# Patient Record
Sex: Female | Born: 1975 | Race: White | Marital: Married | Smoking: Never smoker
Health system: Southern US, Community
[De-identification: ages and names within clinical notes are randomized; demographics above are authoritative.]

## PROBLEM LIST (undated history)

## (undated) DIAGNOSIS — M797 Fibromyalgia: Secondary | ICD-10-CM

## (undated) DIAGNOSIS — F32A Depression, unspecified: Secondary | ICD-10-CM

## (undated) DIAGNOSIS — D649 Anemia, unspecified: Secondary | ICD-10-CM

## (undated) DIAGNOSIS — IMO0002 Reserved for concepts with insufficient information to code with codable children: Secondary | ICD-10-CM

## (undated) DIAGNOSIS — F419 Anxiety disorder, unspecified: Secondary | ICD-10-CM

## (undated) DIAGNOSIS — G43909 Migraine, unspecified, not intractable, without status migrainosus: Secondary | ICD-10-CM

## (undated) DIAGNOSIS — N83209 Unspecified ovarian cyst, unspecified side: Secondary | ICD-10-CM

## (undated) DIAGNOSIS — I959 Hypotension, unspecified: Secondary | ICD-10-CM

## (undated) DIAGNOSIS — D869 Sarcoidosis, unspecified: Secondary | ICD-10-CM

## (undated) DIAGNOSIS — F329 Major depressive disorder, single episode, unspecified: Secondary | ICD-10-CM

## (undated) HISTORY — DX: Anemia, unspecified: D64.9

## (undated) HISTORY — PX: HAND TENDON SURGERY: SHX663

## (undated) HISTORY — DX: Major depressive disorder, single episode, unspecified: F32.9

## (undated) HISTORY — DX: Sarcoidosis, unspecified: D86.9

## (undated) HISTORY — DX: Fibromyalgia: M79.7

## (undated) HISTORY — DX: Hypotension, unspecified: I95.9

## (undated) HISTORY — DX: Reserved for concepts with insufficient information to code with codable children: IMO0002

## (undated) HISTORY — DX: Anxiety disorder, unspecified: F41.9

## (undated) HISTORY — DX: Depression, unspecified: F32.A

## (undated) HISTORY — DX: Unspecified ovarian cyst, unspecified side: N83.209

## (undated) HISTORY — PX: TUBAL LIGATION: SHX77

## (undated) HISTORY — DX: Migraine, unspecified, not intractable, without status migrainosus: G43.909

---

## 1997-02-13 DIAGNOSIS — IMO0002 Reserved for concepts with insufficient information to code with codable children: Secondary | ICD-10-CM

## 1997-02-13 HISTORY — DX: Reserved for concepts with insufficient information to code with codable children: IMO0002

## 2004-02-14 HISTORY — PX: OTHER SURGICAL HISTORY: SHX169

## 2008-02-14 DIAGNOSIS — D869 Sarcoidosis, unspecified: Secondary | ICD-10-CM

## 2008-02-14 HISTORY — DX: Sarcoidosis, unspecified: D86.9

## 2011-02-14 DIAGNOSIS — M797 Fibromyalgia: Secondary | ICD-10-CM

## 2011-02-14 HISTORY — DX: Fibromyalgia: M79.7

## 2011-11-21 ENCOUNTER — Other Ambulatory Visit: Payer: Self-pay | Admitting: *Deleted

## 2011-11-21 ENCOUNTER — Other Ambulatory Visit: Payer: Self-pay

## 2011-11-21 DIAGNOSIS — M5412 Radiculopathy, cervical region: Secondary | ICD-10-CM

## 2011-11-25 ENCOUNTER — Other Ambulatory Visit: Payer: Self-pay

## 2011-11-28 ENCOUNTER — Ambulatory Visit
Admission: RE | Admit: 2011-11-28 | Discharge: 2011-11-28 | Disposition: A | Discharge status: Home / Self Care | Payer: Self-pay | Source: Ambulatory Visit | Attending: *Deleted | Admitting: *Deleted

## 2011-11-28 DIAGNOSIS — M5412 Radiculopathy, cervical region: Secondary | ICD-10-CM

## 2012-10-30 ENCOUNTER — Ambulatory Visit (HOSPITAL_COMMUNITY)
Admission: RE | Admit: 2012-10-30 | Discharge: 2012-10-30 | Disposition: A | Discharge status: Home / Self Care | Payer: BC Managed Care – PPO | Source: Ambulatory Visit | Attending: Physician Assistant | Admitting: Physician Assistant

## 2012-10-30 ENCOUNTER — Ambulatory Visit (INDEPENDENT_AMBULATORY_CARE_PROVIDER_SITE_OTHER): Payer: BC Managed Care – PPO | Admitting: Family Medicine

## 2012-10-30 VITALS — BP 106/70 | HR 72 | Temp 98.5°F | Resp 18 | Ht 68.5 in | Wt 151.2 lb

## 2012-10-30 DIAGNOSIS — N83209 Unspecified ovarian cyst, unspecified side: Secondary | ICD-10-CM | POA: Insufficient documentation

## 2012-10-30 DIAGNOSIS — R102 Pelvic and perineal pain: Secondary | ICD-10-CM

## 2012-10-30 DIAGNOSIS — N949 Unspecified condition associated with female genital organs and menstrual cycle: Secondary | ICD-10-CM

## 2012-10-30 DIAGNOSIS — D869 Sarcoidosis, unspecified: Secondary | ICD-10-CM | POA: Insufficient documentation

## 2012-10-30 DIAGNOSIS — M549 Dorsalgia, unspecified: Secondary | ICD-10-CM

## 2012-10-30 DIAGNOSIS — M545 Low back pain, unspecified: Secondary | ICD-10-CM | POA: Insufficient documentation

## 2012-10-30 DIAGNOSIS — M797 Fibromyalgia: Secondary | ICD-10-CM | POA: Insufficient documentation

## 2012-10-30 DIAGNOSIS — F329 Major depressive disorder, single episode, unspecified: Secondary | ICD-10-CM | POA: Insufficient documentation

## 2012-10-30 LAB — POCT URINALYSIS DIPSTICK
Bilirubin, UA: NEGATIVE
Blood, UA: NEGATIVE
Glucose, UA: NEGATIVE
Nitrite, UA: NEGATIVE
Spec Grav, UA: 1.01

## 2012-10-30 LAB — POCT CBC
HCT, POC: 33.6 % — AB (ref 37.7–47.9)
Lymph, poc: 2.2 (ref 0.6–3.4)
MCH, POC: 26 pg — AB (ref 27–31.2)
MCHC: 31 g/dL — AB (ref 31.8–35.4)
POC Granulocyte: 4.4 (ref 2–6.9)
POC LYMPH PERCENT: 30.3 %L (ref 10–50)
POC MID %: 7.3 %M (ref 0–12)
RDW, POC: 14.9 %

## 2012-10-30 LAB — POCT UA - MICROSCOPIC ONLY
Bacteria, U Microscopic: NEGATIVE
Mucus, UA: NEGATIVE
WBC, Ur, HPF, POC: NEGATIVE

## 2012-10-30 LAB — POCT URINE PREGNANCY: Preg Test, Ur: NEGATIVE

## 2012-10-30 LAB — POCT WET PREP WITH KOH
KOH Prep POC: NEGATIVE
Trichomonas, UA: NEGATIVE

## 2012-10-30 MED ORDER — IOHEXOL 300 MG/ML  SOLN
100.0000 mL | Freq: Once | INTRAMUSCULAR | Status: AC | PRN
  Administered 2012-10-30: 100 mL via INTRAVENOUS

## 2012-10-30 NOTE — Progress Notes (Signed)
Subjective:    Patient ID: Leslie Huber, female    DOB: 05-02-1975, 37 y.o.   MRN: [id number]  HPI This 37 y.o. female presents for evaluation of 3 days of RIGHT sided back pain that wraps around to the RIGHT pelvis.  Expects her menses to begin in about 15 days.  Worse that her typical mittleshmertz pain.  History of frequent UTI.  Has had nocturia for the past several nights, but wasn't surprised since she's increased her fluid volume.  No urgency or frequency.  No hematuria noted.  No vaginal d/c, but a little itchy today.  Some nausea (but 53 year-old child had a GI bug last week), but no fever, chills or vomiting. Has noticed that prolonged sitting causes her RIGHT leg to fall asleep, first noticed yesterday helping her daughter with homework, and then again today completing paperwork in our office.  Acknowledges that she doesn't typically spend much time sitting.  Medications, allergies, past medical history, surgical history, family history, social history and problem list reviewed.   Review of Systems As above. No CP, SOB, HA, dizziness.    Objective:   Physical Exam  Constitutional: She is oriented to person, place, and time. Vital signs are normal. She appears well-developed and well-nourished. No distress.  HENT:  Head: Normocephalic and atraumatic.  Cardiovascular: Normal rate, regular rhythm and normal heart sounds.   Pulmonary/Chest: Effort normal and breath sounds normal.  Abdominal: Soft. Normal appearance and bowel sounds are normal. She exhibits no distension and no mass. There is no hepatosplenomegaly. There is tenderness in the right lower quadrant and suprapubic area. There is no rigidity, no rebound, no guarding, no CVA tenderness, no tenderness at McBurney's point and negative Murphy's sign. No hernia. Hernia confirmed negative in the right inguinal area and confirmed negative in the left inguinal area.  Genitourinary: Vagina normal and uterus normal. Rectal exam shows  tenderness (RLQ). Rectal exam shows no external hemorrhoid, no internal hemorrhoid, no fissure, no mass and anal tone normal. Guaiac negative stool. Pelvic exam was performed with patient supine. No labial fusion. There is no rash, tenderness, lesion or injury on the right labia. There is no rash, tenderness, lesion or injury on the left labia. Cervix exhibits no motion tenderness, no discharge and no friability. Right adnexum displays tenderness. Right adnexum displays no mass and no fullness. Left adnexum displays no mass, no tenderness and no fullness.  Musculoskeletal: Normal range of motion.       Lumbar back: Normal.  Lymphadenopathy:       Right: No inguinal adenopathy present.       Left: No inguinal adenopathy present.  Neurological: She is alert and oriented to person, place, and time.  Skin: Skin is warm and dry. No rash noted. She is not diaphoretic. No pallor.  Psychiatric: She has a normal mood and affect. Her speech is normal and behavior is normal. Judgment normal.     Results for orders placed in visit on 10/30/12  POCT UA - MICROSCOPIC ONLY      Result Value Range   WBC, Ur, HPF, POC neg     RBC, urine, microscopic neg     Bacteria, U Microscopic neg     Mucus, UA neg     Epithelial cells, urine per micros neg     Crystals, Ur, HPF, POC neg     Casts, Ur, LPF, POC neg     Yeast, UA neg    POCT URINALYSIS DIPSTICK  Result Value Range   Color, UA light yellow     Clarity, UA clear     Glucose, UA neg     Bilirubin, UA neg     Ketones, UA neg     Spec Grav, UA 1.010     Blood, UA neg     pH, UA 7.0     Protein, UA neg     Urobilinogen, UA 0.2     Nitrite, UA neg     Leukocytes, UA Negative    POCT CBC      Result Value Range   WBC 7.1  4.6 - 10.2 K/uL   Lymph, poc 2.2  0.6 - 3.4   POC LYMPH PERCENT 30.3  10 - 50 %L   MID (cbc) 0.5  0 - 0.9   POC MID % 7.3  0 - 12 %M   POC Granulocyte 4.4  2 - 6.9   Granulocyte percent 62.4  37 - 80 %G   RBC 4.00 (*)  4.04 - 5.48 M/uL   Hemoglobin 10.4 (*) 12.2 - 16.2 g/dL   HCT, POC 16.1 (*) 09.6 - 47.9 %   MCV 84.1  80 - 97 fL   MCH, POC 26.0 (*) 27 - 31.2 pg   MCHC 31.0 (*) 31.8 - 35.4 g/dL   RDW, POC 04.5     Platelet Count, POC 255  142 - 424 K/uL   MPV 10.2  0 - 99.8 fL  POCT WET PREP WITH KOH      Result Value Range   Trichomonas, UA Negative     Clue Cells Wet Prep HPF POC neg     Epithelial Wet Prep HPF POC 8-12     Yeast Wet Prep HPF POC neg     Bacteria Wet Prep HPF POC small     RBC Wet Prep HPF POC 0-2     WBC Wet Prep HPF POC 5-8     KOH Prep POC Negative    POCT URINE PREGNANCY      Result Value Range   Preg Test, Ur Negative          Assessment & Plan:  Back pain  Pelvic pain in female - Plan: POCT UA - Microscopic Only, POCT urinalysis dipstick, POCT CBC, POCT Wet Prep with KOH, POCT urine pregnancy, CT Abdomen Pelvis W Contrast  Suspect ovarian cyst, but concerned for appendicitis.  CT scan now. May need a pelvic US tomorrow. If CT is negative, would use NSAIDS.  Discussed with Dr. Katrinka Blazing.  Fernande Bras, PA-C Physician Assistant-Certified [facility]

## 2012-10-30 NOTE — Patient Instructions (Addendum)
Proceed to [facility] ([address]). Go to the CT department.

## 2012-10-31 ENCOUNTER — Other Ambulatory Visit: Payer: Self-pay | Admitting: Radiology

## 2012-10-31 DIAGNOSIS — N83209 Unspecified ovarian cyst, unspecified side: Secondary | ICD-10-CM

## 2012-11-07 ENCOUNTER — Ambulatory Visit: Payer: BC Managed Care – PPO

## 2012-11-07 ENCOUNTER — Ambulatory Visit (INDEPENDENT_AMBULATORY_CARE_PROVIDER_SITE_OTHER): Payer: BC Managed Care – PPO | Admitting: Family Medicine

## 2012-11-07 VITALS — BP 120/72 | HR 76 | Temp 98.1°F | Resp 18 | Ht 69.0 in | Wt 149.0 lb

## 2012-11-07 DIAGNOSIS — R1031 Right lower quadrant pain: Secondary | ICD-10-CM

## 2012-11-07 DIAGNOSIS — N83209 Unspecified ovarian cyst, unspecified side: Secondary | ICD-10-CM

## 2012-11-07 DIAGNOSIS — M545 Low back pain: Secondary | ICD-10-CM

## 2012-11-07 DIAGNOSIS — M25569 Pain in unspecified knee: Secondary | ICD-10-CM

## 2012-11-07 DIAGNOSIS — M25561 Pain in right knee: Secondary | ICD-10-CM

## 2012-11-07 DIAGNOSIS — G8929 Other chronic pain: Secondary | ICD-10-CM

## 2012-11-07 LAB — POCT CBC
Lymph, poc: 2.2 (ref 0.6–3.4)
MCH, POC: 26.6 pg — AB (ref 27–31.2)
MCV: 85.4 fL (ref 80–97)
MID (cbc): 0.5 (ref 0–0.9)
POC LYMPH PERCENT: 26.3 %L (ref 10–50)
Platelet Count, POC: 308 10*3/uL (ref 142–424)
RBC: 4.77 M/uL (ref 4.04–5.48)
RDW, POC: 15.6 %
WBC: 8.3 10*3/uL (ref 4.6–10.2)

## 2012-11-07 LAB — POCT URINALYSIS DIPSTICK
Glucose, UA: NEGATIVE
Spec Grav, UA: 1.015
Urobilinogen, UA: 0.2

## 2012-11-07 LAB — POCT UA - MICROSCOPIC ONLY: Crystals, Ur, HPF, POC: NEGATIVE

## 2012-11-07 MED ORDER — TRAMADOL HCL 50 MG PO TABS: 50.0000 mg | ORAL_TABLET | Freq: Four times a day (QID) | ORAL | Status: DC | PRN

## 2012-11-07 MED ORDER — CYCLOBENZAPRINE HCL 5 MG PO TABS: ORAL_TABLET | ORAL | Status: DC

## 2012-11-07 NOTE — Patient Instructions (Signed)
Your blood and urine tests appear ok.  Keep follow up with OBGYN tomorrow. Heat or ice to affected area if improves symptoms. Naprosyn during the day as needed and then flexeril if needed at bedtime for muscle spasm. This can be taken up to every 8 hours, but causes sedation, so should not drive or operate heavy machinery while taking this medicine. Ultram if needed for pain, but be careful combining this with other sedating medicines. Return to the clinic or go to the nearest emergency room if any of your symptoms worsen or new symptoms occur. Back Pain, Adult Low back pain is very common. About 1 in 5 people have back pain.The cause of low back pain is rarely dangerous. The pain often gets better over time.About half of people with a sudden onset of back pain feel better in just 2 weeks. About 8 in 10 people feel better by 6 weeks.  CAUSES Some common causes of back pain include:  Strain of the muscles or ligaments supporting the spine.  Wear and tear (degeneration) of the spinal discs.  Arthritis.  Direct injury to the back. DIAGNOSIS Most of the time, the direct cause of low back pain is not known.However, back pain can be treated effectively even when the exact cause of the pain is unknown.Answering your caregiver's questions about your overall health and symptoms is one of the most accurate ways to make sure the cause of your pain is not dangerous. If your caregiver needs more information, he or she may order lab work or imaging tests (X-rays or MRIs).However, even if imaging tests show changes in your back, this usually does not require surgery. HOME CARE INSTRUCTIONS For many people, back pain returns.Since low back pain is rarely dangerous, it is often a condition that people can learn to [facility] their own.   Remain active. It is stressful on the back to sit or stand in one place. Do not sit, drive, or stand in one place for more than 30 minutes at a time. Take short walks on level  surfaces as soon as pain allows.Try to increase the length of time you walk each day.  Do not stay in bed.Resting more than 1 or 2 days can delay your recovery.  Do not avoid exercise or work.Your body is made to move.It is not dangerous to be active, even though your back may hurt.Your back will likely heal faster if you return to being active before your pain is gone.  Pay attention to your body when you bend and lift. Many people have less discomfortwhen lifting if they bend their knees, keep the load close to their bodies,and avoid twisting. Often, the most comfortable positions are those that put less stress on your recovering back.  Find a comfortable position to sleep. Use a firm mattress and lie on your side with your knees slightly bent. If you lie on your back, put a pillow under your knees.  Only take over-the-counter or prescription medicines as directed by your caregiver. Over-the-counter medicines to reduce pain and inflammation are often the most helpful.Your caregiver may prescribe muscle relaxant drugs.These medicines help dull your pain so you can more quickly return to your normal activities and healthy exercise.  Put ice on the injured area.  Put ice in a plastic bag.  Place a towel between your skin and the bag.  Leave the ice on for 15-20 minutes, 3-4 times a day for the first 2 to 3 days. After that, ice and heat may  be alternated to reduce pain and spasms.  Ask your caregiver about trying back exercises and gentle massage. This may be of some benefit.  Avoid feeling anxious or stressed.Stress increases muscle tension and can worsen back pain.It is important to recognize when you are anxious or stressed and learn ways to manage it.Exercise is a great option. SEEK MEDICAL CARE IF:  You have pain that is not relieved with rest or medicine.  You have pain that does not improve in 1 week.  You have new symptoms.  You are generally not feeling well. SEEK  IMMEDIATE MEDICAL CARE IF:   You have pain that radiates from your back into your legs.  You develop new bowel or bladder control problems.  You have unusual weakness or numbness in your arms or legs.  You develop nausea or vomiting.  You develop abdominal pain.  You feel faint. Document Released: 01/30/2005 Document Revised: 08/01/2011 Document Reviewed: 06/20/2010 [facility] Patient Information 2014 [place], [place].

## 2012-11-07 NOTE — Progress Notes (Signed)
Subjective:    Patient ID: Leslie Huber, female    DOB: [birth year], 37 y.o.   MRN: [id number]  HPI Leslie Huber is a 37 y.o. female  See ov last week. Has appt with gynecologist tomorrow morning, but same pain in R abdomen worsened with start of period this morning. R lower abdominal pain since last week. No fever. Slight urinary frequency past few days. Hx of recurrent UTI's in past, last one few years ago. No diarrhea, nausea for 2 days, no vomiting. Drinking fluids. Less appetite - seems to make pain worse with food. Occasionally has felt pain into back, or at times into R leg. On and off for past week. 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 Vaginal bleeding this am. No bowel or bladder incontinence, no saddle anesthesia, no lower extremity weakness.    Tx: naprosyn last night. Taking naprosyn BID regularly (for sarcoidosis and fibromyalgia). One klonopin to help sleep last night.    Seen 10/30/12 by Porfirio Oar, PA-C for pelvic pain. Sent for CT abdomen/pelvis 10/30/12: ABDOMEN/PELVIS:  Liver: No focal abnormality.  Biliary: No evidence of biliary obstruction or stone.  Pancreas: Unremarkable.  Spleen: Unremarkable.  Adrenals: Unremarkable.  Kidneys and ureters: No hydronephrosis or stone.  Bladder: Unremarkable.  Bowel: No obstruction. Appendix not clearly identified, but there is  no pericecal inflammatory changes.  Retroperitoneum: No mass or adenopathy.  Peritoneum: No free fluid or gas.  Reproductive: 3.6 cm cyst in the right ovary, without evidence of  enhancing nodule. The surrounding ovarian parenchyma does not appear  edematous, arguing against torsion.  Vascular: No acute abnormality.  OSSEOUS: No acute abnormalities. No suspicious lytic or blastic  lesions.  IMPRESSION:  1. No acute intra-abdominal findings.  2. 3.6 cm right ovarian cyst.  Labs from prior OV:  Results for orders placed in visit on 10/30/12  POCT UA - MICROSCOPIC ONLY       Result Value Range   WBC, Ur, HPF, POC neg     RBC, urine, microscopic neg     Bacteria, U Microscopic neg     Mucus, UA neg     Epithelial cells, urine per micros neg     Crystals, Ur, HPF, POC neg     Casts, Ur, LPF, POC neg     Yeast, UA neg    POCT URINALYSIS DIPSTICK      Result Value Range   Color, UA light yellow     Clarity, UA clear     Glucose, UA neg     Bilirubin, UA neg     Ketones, UA neg     Spec Grav, UA 1.010     Blood, UA neg     pH, UA 7.0     Protein, UA neg     Urobilinogen, UA 0.2     Nitrite, UA neg     Leukocytes, UA Negative    POCT CBC      Result Value Range   WBC 7.1  4.6 - 10.2 K/uL   Lymph, poc 2.2  0.6 - 3.4   POC LYMPH PERCENT 30.3  10 - 50 %L   MID (cbc) 0.5  0 - 0.9   POC MID % 7.3  0 - 12 %M   POC Granulocyte 4.4  2 - 6.9   Granulocyte percent 62.4  37 - 80 %G   RBC 4.00 (*) 4.04 - 5.48 M/uL   Hemoglobin 10.4 (*) 12.2 - 16.2 g/dL  HCT, POC 33.6 (*) 37.7 - 47.9 %   MCV 84.1  80 - 97 fL   MCH, POC 26.0 (*) 27 - 31.2 pg   MCHC 31.0 (*) 31.8 - 35.4 g/dL   RDW, POC 16.1     Platelet Count, POC 255  142 - 424 K/uL   MPV 10.2  0 - 99.8 fL  POCT WET PREP WITH KOH      Result Value Range   Trichomonas, UA Negative     Clue Cells Wet Prep HPF POC neg     Epithelial Wet Prep HPF POC 8-12     Yeast Wet Prep HPF POC neg     Bacteria Wet Prep HPF POC small     RBC Wet Prep HPF POC 0-2     WBC Wet Prep HPF POC 5-8     KOH Prep POC Negative    POCT URINE PREGNANCY      Result Value Range   Preg Test, Ur Negative       Review of Systems  Genitourinary: Positive for frequency and flank pain. Negative for dysuria and hematuria.  Musculoskeletal: Positive for back pain and arthralgias. Negative for gait problem.  Neurological: Negative for weakness and numbness.       Objective:   Physical Exam  Vitals reviewed. Constitutional: She is oriented to person, place, and time. She appears well-developed and well-nourished.  HENT:    Head: Normocephalic and atraumatic.  Pulmonary/Chest: Effort normal.  Abdominal: Soft. Normal appearance. She exhibits no distension. There is tenderness in the right lower quadrant and suprapubic area. There is no rebound, no guarding and no CVA tenderness.    Musculoskeletal:       Lumbar back: She exhibits decreased range of motion (min decr flexion. ) and tenderness (R lower paraspinal. ). She exhibits no bony tenderness, no swelling and no spasm.       Back:  Neurological: She is alert and oriented to person, place, and time. She has normal strength. No sensory deficit.  Reflex Scores:      Patellar reflexes are 2+ on the right side and 2+ on the left side.      Achilles reflexes are 2+ on the right side and 2+ on the left side. Able to heel and toe walk without difficulty.   Skin: Skin is warm. No rash noted.  Psychiatric: She has a normal mood and affect. Her behavior is normal.   [facility] reading (PRIMARY) by  Dr. Neva Seat: LS spine: no acute findings. incedental residual contrast enhancement of descending colon?  Results for orders placed in visit on 11/07/12  POCT CBC      Result Value Range   WBC 8.3  4.6 - 10.2 K/uL   Lymph, poc 2.2  0.6 - 3.4   POC LYMPH PERCENT 26.3  10 - 50 %L   MID (cbc) 0.5  0 - 0.9   POC MID % 6.2  0 - 12 %M   POC Granulocyte 5.6  2 - 6.9   Granulocyte percent 67.5  37 - 80 %G   RBC 4.77  4.04 - 5.48 M/uL   Hemoglobin 12.7  12.2 - 16.2 g/dL   HCT, POC 09.6  04.5 - 47.9 %   MCV 85.4  80 - 97 fL   MCH, POC 26.6 (*) 27 - 31.2 pg   MCHC 31.2 (*) 31.8 - 35.4 g/dL   RDW, POC 40.9     Platelet Count, POC 308  142 - 424  K/uL   MPV 10.2  0 - 99.8 fL  POCT URINALYSIS DIPSTICK      Result Value Range   Color, UA yellow     Clarity, UA clear     Glucose, UA neg     Bilirubin, UA neg     Ketones, UA neg     Spec Grav, UA 1.015     Blood, UA large     pH, UA 7.0     Protein, UA neg     Urobilinogen, UA 0.2     Nitrite, UA neg     Leukocytes, UA  Negative    POCT UA - MICROSCOPIC ONLY      Result Value Range   WBC, Ur, HPF, POC 0-1     RBC, urine, microscopic 3-5     Bacteria, U Microscopic small     Mucus, UA neg     Epithelial cells, urine per micros 0-3     Crystals, Ur, HPF, POC neg     Casts, Ur, LPF, POC neg     Yeast, UA neg         Assessment & Plan:  MAKAILA WINDLE is a 37 y.o. female  Pain, low back , episodic R leg radiation. ? Spasm vs strain vs fibromyalgia flair - sx care, stretches, flexeril if needed, cont naprosyn, ultram only if needed. rtc if not improving.   Other and unspecified ovarian cyst, RLQ abdominal pain - Plan: traMADol (ULTRAM) 50 MG tablet if needed.  Reassuring U/a and CBC.  Has follow up with OBGYN tomorrow. rtc precautions.   Meds ordered this encounter  Medications  . cyclobenzaprine (FLEXERIL) 5 MG tablet    Sig: 1 pill by mouth up to every 8 hours as needed. Start with one pill by mouth each bedtime as needed due to sedation    Dispense:  15 tablet    Refill:  0  . traMADol (ULTRAM) 50 MG tablet    Sig: Take 1 tablet (50 mg total) by mouth every 6 (six) hours as needed for pain.    Dispense:  20 tablet    Refill:  0     Patient Instructions  Your blood and urine tests appear ok.  Keep follow up with OBGYN tomorrow. Heat or ice to affected area if improves symptoms. Naprosyn during the day as needed and then flexeril if needed at bedtime for muscle spasm. This can be taken up to every 8 hours, but causes sedation, so should not drive or operate heavy machinery while taking this medicine. Ultram if needed for pain, but be careful combining this with other sedating medicines. Return to the clinic or go to the nearest emergency room if any of your symptoms worsen or new symptoms occur. Back Pain, Adult Low back pain is very common. About 1 in 5 people have back pain.The cause of low back pain is rarely dangerous. The pain often gets better over time.About half of people with a sudden  onset of back pain feel better in just 2 weeks. About 8 in 10 people feel better by 6 weeks.  CAUSES Some common causes of back pain include:  Strain of the muscles or ligaments supporting the spine.  Wear and tear (degeneration) of the spinal discs.  Arthritis.  Direct injury to the back. DIAGNOSIS Most of the time, the direct cause of low back pain is not known.However, back pain can be treated effectively even when the exact cause of the pain is unknown.Answering  your caregiver's questions about your overall health and symptoms is one of the most accurate ways to make sure the cause of your pain is not dangerous. If your caregiver needs more information, he or she may order lab work or imaging tests (X-rays or MRIs).However, even if imaging tests show changes in your back, this usually does not require surgery. HOME CARE INSTRUCTIONS For many people, back pain returns.Since low back pain is rarely dangerous, it is often a condition that people can learn to [facility] their own.   Remain active. It is stressful on the back to sit or stand in one place. Do not sit, drive, or stand in one place for more than 30 minutes at a time. Take short walks on level surfaces as soon as pain allows.Try to increase the length of time you walk each day.  Do not stay in bed.Resting more than 1 or 2 days can delay your recovery.  Do not avoid exercise or work.Your body is made to move.It is not dangerous to be active, even though your back may hurt.Your back will likely heal faster if you return to being active before your pain is gone.  Pay attention to your body when you bend and lift. Many people have less discomfortwhen lifting if they bend their knees, keep the load close to their bodies,and avoid twisting. Often, the most comfortable positions are those that put less stress on your recovering back.  Find a comfortable position to sleep. Use a firm mattress and lie on your side with your  knees slightly bent. If you lie on your back, put a pillow under your knees.  Only take over-the-counter or prescription medicines as directed by your caregiver. Over-the-counter medicines to reduce pain and inflammation are often the most helpful.Your caregiver may prescribe muscle relaxant drugs.These medicines help dull your pain so you can more quickly return to your normal activities and healthy exercise.  Put ice on the injured area.  Put ice in a plastic bag.  Place a towel between your skin and the bag.  Leave the ice on for 15-20 minutes, 3-4 times a day for the first 2 to 3 days. After that, ice and heat may be alternated to reduce pain and spasms.  Ask your caregiver about trying back exercises and gentle massage. This may be of some benefit.  Avoid feeling anxious or stressed.Stress increases muscle tension and can worsen back pain.It is important to recognize when you are anxious or stressed and learn ways to manage it.Exercise is a great option. SEEK MEDICAL CARE IF:  You have pain that is not relieved with rest or medicine.  You have pain that does not improve in 1 week.  You have new symptoms.  You are generally not feeling well. SEEK IMMEDIATE MEDICAL CARE IF:   You have pain that radiates from your back into your legs.  You develop new bowel or bladder control problems.  You have unusual weakness or numbness in your arms or legs.  You develop nausea or vomiting.  You develop abdominal pain.  You feel faint. Document Released: 01/30/2005 Document Revised: 08/01/2011 Document Reviewed: 06/20/2010 [facility] Patient Information 2014 [place], [place].

## 2012-11-08 ENCOUNTER — Telehealth: Payer: Self-pay | Admitting: *Deleted

## 2012-11-08 ENCOUNTER — Ambulatory Visit (INDEPENDENT_AMBULATORY_CARE_PROVIDER_SITE_OTHER): Payer: BC Managed Care – PPO | Admitting: Gynecology

## 2012-11-08 ENCOUNTER — Ambulatory Visit (INDEPENDENT_AMBULATORY_CARE_PROVIDER_SITE_OTHER): Payer: BC Managed Care – PPO

## 2012-11-08 ENCOUNTER — Encounter: Payer: Self-pay | Admitting: Gynecology

## 2012-11-08 VITALS — BP 120/76 | Ht 66.5 in | Wt 148.0 lb

## 2012-11-08 DIAGNOSIS — N920 Excessive and frequent menstruation with regular cycle: Secondary | ICD-10-CM

## 2012-11-08 DIAGNOSIS — N83201 Unspecified ovarian cyst, right side: Secondary | ICD-10-CM | POA: Insufficient documentation

## 2012-11-08 DIAGNOSIS — R102 Pelvic and perineal pain: Secondary | ICD-10-CM

## 2012-11-08 DIAGNOSIS — N949 Unspecified condition associated with female genital organs and menstrual cycle: Secondary | ICD-10-CM

## 2012-11-08 DIAGNOSIS — D649 Anemia, unspecified: Secondary | ICD-10-CM

## 2012-11-08 DIAGNOSIS — N83209 Unspecified ovarian cyst, unspecified side: Secondary | ICD-10-CM

## 2012-11-08 MED ORDER — KETOROLAC TROMETHAMINE 30 MG/ML IJ SOLN
30.0000 mg | Freq: Once | INTRAMUSCULAR | Status: AC
  Administered 2012-11-08: 30 mg via INTRAMUSCULAR

## 2012-11-08 NOTE — Telephone Encounter (Signed)
Pt was seen today given Toradol 30 IM for pain, pt asked when can she take next dose of oral medication? Time ? Please advise

## 2012-11-08 NOTE — Telephone Encounter (Signed)
6 hours after the injection was given in the office

## 2012-11-08 NOTE — Progress Notes (Signed)
Patient is a 37 year old gravida 4 para 3 Ab1 (3 normal spontaneous vaginal delivery one spontaneous AB) who was referred for practice as a courtesy of [facility] because the patient's right lower quadrant pain and ovarian cyst. The patient stated that she has never had these symptoms before. She states her cycles are regular but very heavy lasting 5-6 days using ultra size pads and has suffered with anemia as a result of this for many years. A CT of the abdomen and pelvis was ordered by the facility to rule out the possibility of appendicitis with the following results reported by radiology.   3.6 cm cyst in the right ovary, without evidence of  enhancing nodule. The surrounding ovarian parenchyma does not appear  edematous, arguing against torsion.  Vascular: No acute abnormality.   IMPRESSION:  1. No acute intra-abdominal findings.  2. 3.6 cm right ovarian cyst.   Labs done September 25: CBC normal, hemoglobin 12.7/  Hematocrit 40.7 white blood count 8.3  Urinalysis negative  Exam: Abdomen soft no rebound or guarding slightly tender right lower quadrant Bartholin urethra Skene gland: Within normal limits Vagina: Menstrual blood present patient states this is her second day of her cycle. Cervix: No lesions or discharge Uterus:tenderness in the cul-de-sac Adnexa: Tenderness and fullness in the right adnexa Rectal exam: Not done  Ultrasound today: Uterus measured 8.6 x 5.6 x 4.7 cm with endometrial stripe is 7.7 mm. No uterine abnormality noted. Left ovary normal. Right ovary complex thick wall vascular cyst measuring 16 x 15 x 15 mm with trace amount of free fluid in the cul-de-sac.  Assessment/plan: Problem #1 physiological cyst that may have ruptured cause patient's symptomatology appears to be regressing in size. The patient in no acute distress today. Pain level 4/10. Patient did receive Toradol 30 mg IM in the office. She will continue using the ultrasound  previously prescribed 50 mg every 6 hours when necessary. Problem #2 patient suffers from menorrhagia and at times has had anemia as a result of this. We discussed different treatment options to include: Mirena IUD, Lysteda,  Her option endometrial ablation or low-dose oral contraceptive pill. Patient is interested in proceeding with a Mirena IUD. She will removal of the transformation provided and returned back to the office to have a Mirena IUD placed or to schedule office endometrial ablation. Patient was reassured. Followup report and this note will be submitted to her primary who referred her to our practice.

## 2012-11-08 NOTE — Telephone Encounter (Signed)
Left the below on pt voicemail. 

## 2012-11-11 ENCOUNTER — Ambulatory Visit: Payer: BC Managed Care – PPO | Admitting: Gynecology

## 2012-11-12 ENCOUNTER — Telehealth: Payer: Self-pay

## 2012-11-12 NOTE — Telephone Encounter (Signed)
I called patient to inform her I checked ins benefits for Mirena IUD and Her Option ablation in the office and both are billable codes with a $25 copymt then ins pays 100%.  No precert required for either.   Patient said she is undecided and will be talking with husband and will get back with [place].

## 2012-12-16 ENCOUNTER — Ambulatory Visit (INDEPENDENT_AMBULATORY_CARE_PROVIDER_SITE_OTHER): Payer: BC Managed Care – PPO | Admitting: Gynecology

## 2012-12-16 ENCOUNTER — Encounter: Payer: Self-pay | Admitting: Gynecology

## 2012-12-16 ENCOUNTER — Other Ambulatory Visit (HOSPITAL_COMMUNITY)
Admission: RE | Admit: 2012-12-16 | Discharge: 2012-12-16 | Disposition: A | Discharge status: Home / Self Care | Payer: BC Managed Care – PPO | Source: Ambulatory Visit | Attending: Gynecology | Admitting: Gynecology

## 2012-12-16 VITALS — BP 124/76 | Ht 68.0 in | Wt 158.0 lb

## 2012-12-16 DIAGNOSIS — Z01419 Encounter for gynecological examination (general) (routine) without abnormal findings: Secondary | ICD-10-CM | POA: Insufficient documentation

## 2012-12-16 DIAGNOSIS — L570 Actinic keratosis: Secondary | ICD-10-CM

## 2012-12-16 DIAGNOSIS — N946 Dysmenorrhea, unspecified: Secondary | ICD-10-CM

## 2012-12-16 DIAGNOSIS — N920 Excessive and frequent menstruation with regular cycle: Secondary | ICD-10-CM

## 2012-12-16 DIAGNOSIS — Z1151 Encounter for screening for human papillomavirus (HPV): Secondary | ICD-10-CM | POA: Insufficient documentation

## 2012-12-16 LAB — COMPREHENSIVE METABOLIC PANEL
AST: 18 U/L (ref 0–37)
Alkaline Phosphatase: 65 U/L (ref 39–117)
BUN: 10 mg/dL (ref 6–23)
Calcium: 9.2 mg/dL (ref 8.4–10.5)
Chloride: 101 mEq/L (ref 96–112)
Creat: 0.78 mg/dL (ref 0.50–1.10)
Glucose, Bld: 87 mg/dL (ref 70–99)
Potassium: 4.1 mEq/L (ref 3.5–5.3)

## 2012-12-16 LAB — CHOLESTEROL, TOTAL: Cholesterol: 180 mg/dL (ref 0–200)

## 2012-12-16 LAB — TSH: TSH: 1.762 u[IU]/mL (ref 0.350–4.500)

## 2012-12-16 NOTE — Progress Notes (Signed)
Leslie Huber 1975-08-19 [id number]   History:    37 y.o.  for annual gyn exam who was seen in the office on September 26 as a result of her referral from the urgent medical [facility] family [facility] practice as a result of a right ovarian cyst. She had a CT scan emergency room to rule out appendicitis and the following was noted:  3.6 cm cyst in the right ovary, without evidence of  enhancing nodule. The surrounding ovarian parenchyma does not appear  edematous, arguing against torsion.  Vascular: No acute abnormality.   An ultrasound was done in office which demonstrated the following: Uterus measured 8.6 x 5.6 x 4.7 cm with endometrial stripe is 7.7 mm. No uterine abnormality noted. Left ovary normal. Right ovary complex thick wall vascular cyst measuring 16 x 15 x 15 mm with trace amount of free fluid in the cul-de-sac.  The patient suffers from heavy periods lasting 5-7 days having to use several tampons. She had previously been provided literature formation on the Mirena IUD, oral contraceptive pill, endometrial ablation or Lysteda the patient has not decided which route. She has had a previous tubal ligation procedure in the past.  Past medical history,surgical history, family history and social history were all reviewed and documented in the EPIC chart.  Gynecologic History Patient's last menstrual period was 12/16/2012. Contraception: tubal ligation Last Pap: 2012. Results were: normal Last mammogram: not indicated. Results were: none indicated  Obstetric History OB History  Gravida Para Term Preterm AB SAB TAB Ectopic Multiple Living  4 3   1 1    3     # Outcome Date GA Lbr Len/2nd Weight Sex Delivery Anes PTL Lv  4 SAB           3 PAR           2 PAR           1 PAR                ROS: A ROS was performed and pertinent positives and negatives are included in the history.  GENERAL: No fevers or chills. HEENT: No change in vision, no earache, sore throat or sinus congestion.  NECK: No pain or stiffness. CARDIOVASCULAR: No chest pain or pressure. No palpitations. PULMONARY: No shortness of breath, cough or wheeze. GASTROINTESTINAL: No abdominal pain, nausea, vomiting or diarrhea, melena or bright red blood per rectum. GENITOURINARY: No urinary frequency, urgency, hesitancy or dysuria. MUSCULOSKELETAL: No joint or muscle pain, no back pain, no recent trauma. DERMATOLOGIC: No rash, no itching, no lesions. ENDOCRINE: No polyuria, polydipsia, no heat or cold intolerance. No recent change in weight. HEMATOLOGICAL: No anemia or easy bruising or bleeding. NEUROLOGIC: No headache, seizures, numbness, tingling or weakness. PSYCHIATRIC: No depression, no loss of interest in normal activity or change in sleep pattern.     Exam: chaperone present  BP 124/76  Ht 5\' 8"  (1.727 m)  Wt 158 lb (71.668 kg)  BMI 24.03 kg/m2  LMP 12/16/2012  Body mass index is 24.03 kg/(m^2).  General appearance : Well developed well nourished female. No acute distress HEENT: Neck supple, trachea midline, no carotid bruits, no thyroidmegaly Lungs: Clear to auscultation, no rhonchi or wheezes, or rib retractions  Heart: Regular rate and rhythm, no murmurs or gallops Breast:Examined in sitting and supine position were symmetrical in appearance, no palpable masses or tenderness,  no skin retraction, no nipple inversion, no nipple discharge, no skin discoloration, no axillary or supraclavicular lymphadenopathy,light brown raised  area 4 fingerbreadths form of areolar region appears to be keratosis Abdomen: no palpable masses or tenderness, no rebound or guarding Extremities: no edema or skin discoloration or tenderness  Pelvic:  Bartholin, Urethra, Skene Glands: Within normal limits             Vagina: No gross lesions or discharge,menstrual blood  Cervix: No gross lesions or discharge  Uterus  anteverted, normal size, shape and consistency, non-tender and mobile  Adnexa  Without masses or  tenderness  Anus and perineum  normal   Rectovaginal  normal sphincter tone without palpated masses or tenderness             Hemoccult none indicated     Assessment/Plan:  37 y.o. female for annual exam with a recent episode of right ovarian cyst which has resolved. Patient's menstrual cycle started today a few days late. Patient will like to proceed with outpatient endometrial ablation here in our office in the near future. She had a normal CBC in September so the only lab work done today are cholesterol, comprehensive metabolic panel and TSH. She did bring up to my attention in area on her left breast that looks like keratosis but I would recommend that she sees the dermatologist for further assessment possible biopsy since her mother has had history of skin cancer. Pap smear was done today. The patient was given requisitions for her to schedule her mammogram as well.    Note: This dictation was prepared with  Dragon/digital dictation along withSmart phrase technology. Any transcriptional errors that result from this process are unintentional.   Ok Edwards MD, 1:27 PM 12/16/2012

## 2012-12-16 NOTE — Patient Instructions (Addendum)
Endometrial Ablation Endometrial ablation removes the lining of the uterus (endometrium). It is usually a same day, outpatient treatment. Ablation helps avoid major surgery (such as a hysterectomy). A hysterectomy is removal of the cervix and uterus. Endometrial ablation has less risk and complications, has a shorter recovery period and is less expensive. After endometrial ablation, most women will have little or no menstrual bleeding. You may not keep your fertility. Pregnancy is no longer likely after this procedure but if you are pre-menopausal, you still need to use a reliable method of birth control following the procedure because pregnancy can occur. REASONS TO HAVE THE PROCEDURE MAY INCLUDE:  Heavy periods.  Bleeding that is causing anemia.  Anovulatory bleeding, very irregular, bleeding.  Bleeding submucous fibroids (on the lining inside the uterus) if they are smaller than 3 centimeters. REASONS NOT TO HAVE THE PROCEDURE MAY INCLUDE:  You wish to have more children.  You have a pre-cancerous or cancerous problem. The cause of any abnormal bleeding must be diagnosed before having the procedure.  You have pain coming from the uterus.  You have a submucus fibroid larger than 3 centimeters.  You recently had a baby.  You recently had an infection in the uterus.  You have a severe retro-flexed, tipped uterus and cannot insert the instrument to do the ablation.  You had a Cesarean section or deep major surgery on the uterus.  The inner cavity of the uterus is too large for the endometrial ablation instrument. RISKS AND COMPLICATIONS   Perforation of the uterus.  Bleeding.  Infection of the uterus, bladder or vagina.  Injury to surrounding organs.  Cutting the cervix.  An air bubble to the lung (air embolus).  Pregnancy following the procedure.  Failure of the procedure to help the problem requiring hysterectomy.  Decreased ability to diagnose cancer in the lining of  the uterus. BEFORE THE PROCEDURE  The lining of the uterus must be tested to make sure there is no pre-cancerous or cancer cells present.  Medications may be given to make the lining of the uterus thinner.  Ultrasound may be used to evaluate the size and look for abnormalities of the uterus.  Future pregnancy is not desired. PROCEDURE  There are different ways to destroy the lining of the uterus.   Resectoscope - radio frequency-alternating electric current is the most common one used.  Cryotherapy - freezing the lining of the uterus.  Heated Free Liquid - heated salt (saline) solution inserted into the uterus.  Microwave - uses high energy microwaves in the uterus.  Thermal Balloon - a catheter with a balloon tip is inserted into the uterus and filled with heated fluid. Your caregiver will talk with you about the method used in this clinic. They will also instruct you on the pros and cons of the procedure. Endometrial ablation is performed along with a procedure called operative hysteroscopy. A narrow viewing tube is inserted through the birth canal (vagina) and through the cervix into the uterus. A tiny camera attached to the viewing tube (hysteroscope) allows the uterine cavity to be shown on a TV monitor during surgery. Your uterus is filled with a harmless liquid to make the procedure easier. The lining of the uterus is then removed. The lining can also be removed with a resectoscope which allows your surgeon to cut away the lining of the uterus under direct vision. Usually, you will be able to go home within an hour after the procedure. HOME CARE INSTRUCTIONS   Do   not drive for 24 hours.  No tampons, douching or intercourse for 2 weeks or until your caregiver approves.  Rest at home for 24 to 48 hours. You may then resume normal activities unless told differently by your caregiver.  Take your temperature two times a day for 4 days, and record it.  Take any medications your  caregiver has ordered, as directed.  Use some form of contraception if you are pre-menopausal and do not want to get pregnant. Bleeding after the procedure is normal. It varies from light spotting and mildly watery to bloody discharge for 4 to 6 weeks. You may also have mild cramping. Only take over-the-counter or prescription medicines for pain, discomfort, or fever as directed by your caregiver. Do not use aspirin, as this may aggravate bleeding. Frequent urination during the first 24 hours is normal. You will not know how effective your surgery is until at least 3 months after the surgery. SEEK IMMEDIATE MEDICAL CARE IF:   Bleeding is heavier than a normal menstrual cycle.  An oral temperature above 102 F (38.9 C) develops.  You have increasing cramps or pains not relieved with medication or develop belly (abdominal) pain which does not seem to be related to the same area of earlier cramping and pain.  You are light headed, weak or have fainting episodes.  You develop pain in the shoulder strap areas.  You have chest or leg pain.  You have abnormal vaginal discharge.  You have painful urination. Document Released: 12/10/2003 Document Revised: 04/24/2011 Document Reviewed: 03/09/2007 [facility] Patient Information 2014 [place], [place]. Mammogram Tips Healthy women should begin getting mammograms every year or two once they reach age 86, and once a year when they reach age 19. Here are tips:  Find an experienced, high-volume center with accomplished radiologists. You can ask for their credentials.  Ask to see the certificate showing the center is approved by the U.S. Food and Drug Administration.  Use the same center regularly, so it is easier to compare your new mammograms with your old ones.  Bring a list of places you have had mammograms, dates, biopsies or other breast treatments. Bring old mammograms with you or have them sent to your primary caregiver.  Describe any breast  problems to your caregiver or the person doing the mammogram. Be ready to give past surgeries, birth control pills, hormone use, breast implants, growths, moles, breast scars and family or personal history of breast cancer.  Call your doctor or center to check on the mammogram if you hear nothing within 10 days. Do not assume everything was normal.  To protect your privacy, the mammogram results cannot be given over the phone or to anyone but you.  Radiation from a mammogram is very low and does not pose a radiation risk.  Mammograms can detect breast problems other than breast cancer.  You may be asked stand or sit in front of the X-ray machine.  Two small plastic or glass plates are placed around the breast when taking the X-ray.  If you are menstruating, schedule your mammogram a week after your menstrual period.  Do not wear deodorants, powder or perfume when getting a mammogram.  Wash your breasts and under your arms before getting a mammogram.  Wear cloths that are easy for you to undress and dress.  Arrive at the center at least 15 minutes before the mammogram is scheduled.  There may be slight discomfort during the mammogram, but it goes away shortly after the test.  Try to relax as much as possible during the mammogram.  Talk to your caregiver if you do not understand the results of the mammogram.  Follow the recommendations of your caregiver regarding further tests and treatments if needed.  Get a second opinion if you are concerned or question the results of the mammogram, further tests or treatment if needed.  Continue with monthly self-breast exams and yearly caregiver exams even if the mammogram is normal.  Your caregiver may recommend getting a mammogram before age 7 and more often if you are at high risk for developing breast cancer. Document Released: 05/18/2005 Document Revised: 04/24/2011 Document Reviewed: 01/24/2008 [facility] Patient Information 2014  [place], [place].

## 2012-12-18 ENCOUNTER — Telehealth: Payer: Self-pay

## 2012-12-18 NOTE — Telephone Encounter (Signed)
We received patient questionnaire and patient posed question for her physician. See last question below:  Questionnaire: Questionnaire  Question: How are you feeling after your recent visit? Answer:   mostly better  Question: Does the recommended course of treatment seem to be helping your symptoms? Answer:   have not had chance to make apt. for cryablation,   Question: Are you experiencing any side effects from your recommended treatment? Answer:   have not done it yet  Question: is there anything else you would like to ask your physician? Answer:   could the pain I have been having be endometritis not cysts?  If so will cryoablation help that also?

## 2012-12-19 ENCOUNTER — Other Ambulatory Visit: Payer: Self-pay

## 2012-12-19 NOTE — Telephone Encounter (Signed)
I message patient in her My Chart back with Dr. Manuela Schwartz response to her question that she emailed to [place] on My Chart.

## 2012-12-19 NOTE — Telephone Encounter (Signed)
Endometritis is an inflammation of the uterine lining which is usually treated with antibiotics. Patient usually have pelvic pain throughout the month and may have a low-grade fever. If you would like you can return to the office so we can discuss further details before proceeding with your endometrial ablation.

## 2012-12-27 ENCOUNTER — Telehealth: Payer: Self-pay

## 2012-12-27 NOTE — Telephone Encounter (Signed)
Patient called stating she wants to schedule endo ablation. I checked her ins and she has $50 copymt then 100%.  Per chart her LMP was 12/16/12.  I told her she was too late in this cycle for scheduling as she may be ovulating since this is Day 12 of cycle.  I told her we schedule in coordination with menses and will start her on medication on Day 3 to prep lining.  I left all this info on her voice mail and asked her to call me and let me know if she could predict her next menses and we could go ahead and try to set a date for Dec.

## 2012-12-28 NOTE — Progress Notes (Signed)
History and physical exam reviewed in detail with Porfirio Oar, PA-C.  Agree with A/P.

## 2012-12-30 ENCOUNTER — Telehealth: Payer: Self-pay

## 2012-12-30 NOTE — Telephone Encounter (Signed)
Years she would need a sonohysterogram and endometrial biopsy with consultation and then we can schedule her ablation after placing her on Prometrium for 2 weeks as we always do

## 2012-12-30 NOTE — Telephone Encounter (Signed)
Patient is interested in scheduling Her Option Ablation.  I have discussed ins benefits with her. She said she remembered you telling her she would need biopsy prior.  Please advise does she need SHGM?  Endo bx?

## 2012-12-31 NOTE — Telephone Encounter (Signed)
Patient informed.  Leslie Huber will speak with to schedule SHGM.  She thinks she is midcycle now and anticipates menses early Dec. We discussed Dec 19 and she thinks that will work fine but needs to make some arrangements for children. I am holding the time and she will call to let me know if this will work and we will proceed.

## 2013-01-01 ENCOUNTER — Other Ambulatory Visit: Payer: Self-pay | Admitting: Gynecology

## 2013-01-01 DIAGNOSIS — N949 Unspecified condition associated with female genital organs and menstrual cycle: Secondary | ICD-10-CM

## 2013-01-01 DIAGNOSIS — N920 Excessive and frequent menstruation with regular cycle: Secondary | ICD-10-CM

## 2013-01-22 ENCOUNTER — Ambulatory Visit (INDEPENDENT_AMBULATORY_CARE_PROVIDER_SITE_OTHER): Payer: BC Managed Care – PPO | Admitting: Gynecology

## 2013-01-22 ENCOUNTER — Other Ambulatory Visit: Payer: Self-pay | Admitting: Gynecology

## 2013-01-22 ENCOUNTER — Ambulatory Visit: Payer: BC Managed Care – PPO | Admitting: Gynecology

## 2013-01-22 ENCOUNTER — Other Ambulatory Visit: Payer: BC Managed Care – PPO

## 2013-01-22 ENCOUNTER — Ambulatory Visit (INDEPENDENT_AMBULATORY_CARE_PROVIDER_SITE_OTHER): Payer: BC Managed Care – PPO

## 2013-01-22 DIAGNOSIS — N938 Other specified abnormal uterine and vaginal bleeding: Secondary | ICD-10-CM

## 2013-01-22 DIAGNOSIS — N8300 Follicular cyst of ovary, unspecified side: Secondary | ICD-10-CM

## 2013-01-22 DIAGNOSIS — N920 Excessive and frequent menstruation with regular cycle: Secondary | ICD-10-CM

## 2013-01-22 DIAGNOSIS — Q51810 Arcuate uterus: Secondary | ICD-10-CM

## 2013-01-22 DIAGNOSIS — N831 Corpus luteum cyst of ovary, unspecified side: Secondary | ICD-10-CM

## 2013-01-22 DIAGNOSIS — N946 Dysmenorrhea, unspecified: Secondary | ICD-10-CM

## 2013-01-22 DIAGNOSIS — N852 Hypertrophy of uterus: Secondary | ICD-10-CM

## 2013-01-22 DIAGNOSIS — N949 Unspecified condition associated with female genital organs and menstrual cycle: Secondary | ICD-10-CM

## 2013-01-22 NOTE — Telephone Encounter (Signed)
Patient had called back and said Dec was not going to work out. She wants to plan for Jan.  I scheduled her for Jan 19 9:00am and she will call me with start of menses so that I can call the Prometrium in for her.

## 2013-01-22 NOTE — Progress Notes (Signed)
   Patient presented to the office today for ultrasound and sonohysterogram as part of her evaluation preoperatively prior to her scheduled endometrial ablation next month as a result of her menorrhagia and dysmenorrhea. See previous note dated September 26 and November 3 respectively for details.  Ultrasound today: Uterus measured 8.9 x 5.6 x 4.3 cm with endometrial stripe of 5.8 mm. Right and left ovary were otherwise normal previously seen by Bruce is not noted there was possibly excessive bowel activity. There was no fluid in the cul-de-sac. After instilling normal saline into the uterine cavity there was no intracavitary defect.  Patient was counseled for endometrial biopsy and a sterile Pipelle had been inserted into the uterine cavity after the cervix had been cleansed with Betadine solution. Her uterus sounded to 7-1/2 cm and tissue obtained was submitted for histological evaluation.  Patient's recent lab work consisting of compromise metabolic panel, screening cholesterol, blood sugar, TSH and Pap smear were all normal.  Assessment/plan: Patient scheduled for endometrial ablation (HerOption technique) January 2015 depending on today's endometrial biopsy reported benign. Patient previously had been presented with ligature information procedure as well as the risks benefits and pros and cons having been outlined. She has had a previous tubal sterilization procedure. She will be seen in the office the day before the procedure for preop exam and placement of laminaria intracervically and also to provide her prescriptions.

## 2013-02-17 ENCOUNTER — Telehealth: Payer: Self-pay

## 2013-02-17 MED ORDER — PROGESTERONE MICRONIZED 200 MG PO CAPS: 200.0000 mg | ORAL_CAPSULE | Freq: Every day | ORAL | Status: DC

## 2013-02-17 NOTE — Telephone Encounter (Signed)
Patient called back. Menses began Saturday 02/15/13.  Rx sent to pharmacy.

## 2013-02-17 NOTE — Telephone Encounter (Signed)
Patient called to let me know her menses began "over the weekend". She is scheduled 02/26/13 for Her Option endometrial ablation and needs Rx for Prometrium called in.  I called her back and left message and asked her to call me and let me know what day she actually started. I reminded her she will start Prometrium on the 3rd day of her period and take HS until procedure.

## 2013-02-19 ENCOUNTER — Telehealth: Payer: Self-pay

## 2013-02-19 NOTE — Telephone Encounter (Signed)
Patient called to cancel Her Option Ablation scheduled for next week.  She said her family has come down with the flu and also her grandfather is ill and in the hospital and she may have to travel to [place] next week.  She wants to cancel for now and when things settle down she said she will call me back to schedule again.  Appts cancelled.

## 2013-02-19 NOTE — Telephone Encounter (Signed)
Okay thank you

## 2013-02-25 ENCOUNTER — Ambulatory Visit: Payer: BC Managed Care – PPO | Admitting: Gynecology

## 2013-02-25 ENCOUNTER — Ambulatory Visit (INDEPENDENT_AMBULATORY_CARE_PROVIDER_SITE_OTHER): Payer: BC Managed Care – PPO | Admitting: Family Medicine

## 2013-02-25 VITALS — BP 101/61 | HR 76 | Temp 98.4°F | Resp 16 | Ht 69.0 in | Wt 157.0 lb

## 2013-02-25 DIAGNOSIS — R5383 Other fatigue: Principal | ICD-10-CM

## 2013-02-25 DIAGNOSIS — R5381 Other malaise: Secondary | ICD-10-CM

## 2013-02-25 LAB — COMPREHENSIVE METABOLIC PANEL
ALK PHOS: 62 U/L (ref 39–117)
ALT: 11 U/L (ref 0–35)
AST: 19 U/L (ref 0–37)
Albumin: 4.5 g/dL (ref 3.5–5.2)
BILIRUBIN TOTAL: 0.3 mg/dL (ref 0.3–1.2)
BUN: 13 mg/dL (ref 6–23)
CO2: 30 mEq/L (ref 19–32)
Calcium: 9.7 mg/dL (ref 8.4–10.5)
Chloride: 102 mEq/L (ref 96–112)
Creat: 0.81 mg/dL (ref 0.50–1.10)
Glucose, Bld: 78 mg/dL (ref 70–99)
Potassium: 4.2 mEq/L (ref 3.5–5.3)
SODIUM: 138 meq/L (ref 135–145)
Total Protein: 7.6 g/dL (ref 6.0–8.3)

## 2013-02-25 LAB — POCT CBC
Granulocyte percent: 63.8 %G (ref 37–80)
HEMATOCRIT: 40.1 % (ref 37.7–47.9)
HEMOGLOBIN: 12 g/dL — AB (ref 12.2–16.2)
Lymph, poc: 2.6 (ref 0.6–3.4)
MCH, POC: 25.5 pg — AB (ref 27–31.2)
MCHC: 29.9 g/dL — AB (ref 31.8–35.4)
MCV: 85.2 fL (ref 80–97)
MID (cbc): 0.6 (ref 0–0.9)
MPV: 9.2 fL (ref 0–99.8)
POC GRANULOCYTE: 5.6 (ref 2–6.9)
POC LYMPH PERCENT: 29.1 %L (ref 10–50)
POC MID %: 7.1 % (ref 0–12)
Platelet Count, POC: 351 10*3/uL (ref 142–424)
RBC: 4.71 M/uL (ref 4.04–5.48)
RDW, POC: 15.9 %
WBC: 8.8 10*3/uL (ref 4.6–10.2)

## 2013-02-25 LAB — GLUCOSE, POCT (MANUAL RESULT ENTRY): POC GLUCOSE: 86 mg/dL (ref 70–99)

## 2013-02-25 LAB — POCT URINE PREGNANCY: Preg Test, Ur: NEGATIVE

## 2013-02-25 MED ORDER — CLONAZEPAM 0.5 MG PO TBDP: 0.5000 mg | ORAL_TABLET | Freq: Two times a day (BID) | ORAL | Status: DC | PRN

## 2013-02-25 MED ORDER — DULOXETINE HCL 60 MG PO CPEP: 120.0000 mg | ORAL_CAPSULE | Freq: Every day | ORAL | Status: DC

## 2013-02-25 NOTE — Progress Notes (Signed)
[facility] [address], [place] [address] [phone]  Date:  02/25/2013   Name:  Leslie Huber   DOB:  [birth year]   MRN:  [id number]  PCP:  Nilda Simmer, MD    Chief Complaint: Tremors and Medication Refill   History of Present Illness:  Leslie Huber is a 38 y.o. very pleasant female patient who presents with the following:  She notes tremor, shaking muscles and "feeling weak and tired."  She does not really have a visible tremor but "it feels like I'm shaking."  She has noted this for about 3 days.  It feels "like restless legs but all over."   She has had some hand tremor in the past- usually associated with not eating enough.   She takes cymbalta and klonopin prn.  She has been on cymbalta for about 2.5 years   She is supposed to have an endometrial ablation soon due to menorrhagia and dysmenorrhea. This did have to be postponed for a family illness.  She does tend to have heavy menses.  She is s/p BTL and does not think she is at risk of pregnancy   She has 3 children.  She occasionally has some headaches- nothing out of the ordinary.  No diarrhea.  No fever  She does feel like she has to concentrated more to accomplish tasks than at her baseline Overall she does not feel ill, does not feel like she has the flu    Patient Active Problem List   Diagnosis Date Noted  . Dysmenorrhea 01/22/2013  . Menorrhagia 01/22/2013  . Sarcoidosis 10/30/2012  . Fibromyalgia, secondary 10/30/2012  . Depression 10/30/2012    Past Medical History  Diagnosis Date  . Anemia   . Anxiety   . Depression   . 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 Fibromyalgia, secondary 2013    in relation to Sarcoidosis  . Ulcer 1999    duodenum    Past Surgical History  Procedure Laterality Date  . Tubal ligation    . Hand tendon surgery Left     pinky, 5th digit  . Lymphnode Right 2006    pelvic area; benign    History  Substance Use  Topics  . Smoking status: Never Smoker   . Smokeless tobacco: Never Used  . Alcohol Use: 0 - 1 oz/week    0-2 drink(s) per week    Family History  Problem Relation Age of Onset  . Thyroid disease Mother   . Sarcoidosis Mother   . Cancer Mother     SKIN  . Hypertension Father     Allergies  Allergen Reactions  . Sulfa Antibiotics Anaphylaxis    Medication list has been reviewed and updated.  Current Outpatient Prescriptions on File Prior to Visit  Medication Sig Dispense Refill  . clonazePAM (KLONOPIN) 0.5 MG disintegrating tablet Take 0.5 mg by mouth as needed for anxiety.      . DULoxetine (CYMBALTA) 60 MG capsule Take 120 mg by mouth daily.      . IRON PO Take 45 mg by mouth daily.      . naproxen sodium (ANAPROX) 220 MG tablet Take 220 mg by mouth 2 (two) times daily with a meal.       No current facility-administered medications on file prior to visit.    Review of Systems:  As per HPI- otherwise negative.   Physical Examination: Filed Vitals:   02/25/13 1127  BP: 122/71  Pulse: 84  Temp: 98.4 F (36.9 C)  Resp: 16   Filed Vitals:   02/25/13 1127  Height: 5\' 9"  (1.753 m)  Weight: 157 lb (71.215 kg)   Body mass index is 23.17 kg/(m^2). Ideal Body Weight: Weight in (lb) to have BMI = 25: 168.9  GEN: WDWN, NAD, Non-toxic, A & O x 3, slim build, looks well HEENT: Atraumatic, Normocephalic. Neck supple. No masses, No LAD.  Bilateral TM wnl, oropharynx normal.  PEERL,EOMI.   Ears and Nose: No external deformity. CV: RRR, No M/G/R. No JVD. No thrill. No extra heart sounds. PULM: CTA B, no wheezes, crackles, rhonchi. No retractions. No resp. distress. No accessory muscle use. ABD: S, NT, ND, +BS. No rebound. No HSM. EXTR: No c/c/e NEURO Normal gait.  Normal strength, sensation and DTR all extremities.  No obvious tremor on exam.   PSYCH: Normally interactive. Conversant. Not depressed or anxious appearing.  Calm demeanor.  She is alert and articulate.  No  evidence of distress and certainly no MS change  Results for orders placed in visit on 02/25/13  POCT CBC      Result Value Range   WBC 8.8  4.6 - 10.2 K/uL   Lymph, poc 2.6  0.6 - 3.4   POC LYMPH PERCENT 29.1  10 - 50 %L   MID (cbc) 0.6  0 - 0.9   POC MID % 7.1  0 - 12 %M   POC Granulocyte 5.6  2 - 6.9   Granulocyte percent 63.8  37 - 80 %G   RBC 4.71  4.04 - 5.48 M/uL   Hemoglobin 12.0 (*) 12.2 - 16.2 g/dL   HCT, POC 78.240.1  95.637.7 - 47.9 %   MCV 85.2  80 - 97 fL   MCH, POC 25.5 (*) 27 - 31.2 pg   MCHC 29.9 (*) 31.8 - 35.4 g/dL   RDW, POC 21.315.9     Platelet Count, POC 351  142 - 424 K/uL   MPV 9.2  0 - 99.8 fL  GLUCOSE, POCT (MANUAL RESULT ENTRY)      Result Value Range   POC Glucose 86  70 - 99 mg/dl  POCT URINE PREGNANCY      Result Value Range   Preg Test, Ur Negative     Positive orthostatic drop in her BP  Assessment and Plan: Other malaise and fatigue - Plan: POCT CBC, POCT glucose (manual entry), POCT urine pregnancy, Comprehensive metabolic panel, TSH, DULoxetine (CYMBALTA) 60 MG capsule, clonazePAM (KLONOPIN) 0.5 MG disintegrating tablet  Victorino DikeJennifer is here today with a non- specific feeling of fatigue and shakiness.  No visible tremor.   Suspect she may suffer from lower BP or hypoglycemia.  Encouraged her to be sure and eat regularly and to drink plenty of fluids/  Will check other labs as above and follow-up with her.  If she is worse in the meantime asked her to please call.  No bradycardia.    Signed Abbe AmsterdamJessica Danita Proud, MD

## 2013-02-25 NOTE — Patient Instructions (Signed)
Work on increasing your fluid (gatorade) intake and eat extra salt for the next few days.  I will be in touch with the rest of your labs.    Let me know if you are feeling worse or have any other problems or concerns.

## 2013-02-26 ENCOUNTER — Other Ambulatory Visit: Payer: BC Managed Care – PPO

## 2013-02-26 ENCOUNTER — Ambulatory Visit: Payer: BC Managed Care – PPO | Admitting: Gynecology

## 2013-02-26 LAB — TSH: TSH: 1.707 u[IU]/mL (ref 0.350–4.500)

## 2013-02-28 ENCOUNTER — Telehealth: Payer: Self-pay | Admitting: *Deleted

## 2013-02-28 NOTE — Telephone Encounter (Signed)
[facility] Mychart After Visit Questionnaire    Question 02/27/2013 9:03 AM   How are you feeling after your recent visit? mostly better   Does the recommended course of treatment seem to be helping your symptoms? yes, after 2 bottles of gatorage I started ro feel better..seem to be drinking bout 2 a day to keep the shakey ffeelingaway   Are you experiencing any side effects from your recommended treatment? no   is there anything else you would like to ask your physician? still feel easily fatigued is there anything else i can do? very happy noy feeling like I am shaking nymore      Can respond through [employer]

## 2013-03-07 ENCOUNTER — Telehealth: Payer: Self-pay | Admitting: Family Medicine

## 2013-03-07 DIAGNOSIS — R531 Weakness: Secondary | ICD-10-CM

## 2013-03-07 NOTE — Telephone Encounter (Signed)
Made her an appt for monday

## 2013-03-10 ENCOUNTER — Ambulatory Visit (INDEPENDENT_AMBULATORY_CARE_PROVIDER_SITE_OTHER): Payer: BC Managed Care – PPO | Admitting: Family Medicine

## 2013-03-10 ENCOUNTER — Inpatient Hospital Stay
Admission: RE | Admit: 2013-03-10 | Discharge: 2013-03-10 | Disposition: A | Discharge status: Home / Self Care | Payer: Self-pay | Source: Ambulatory Visit | Attending: Family Medicine | Admitting: Family Medicine

## 2013-03-10 ENCOUNTER — Encounter: Payer: Self-pay | Admitting: Family Medicine

## 2013-03-10 ENCOUNTER — Ambulatory Visit: Payer: BC Managed Care – PPO

## 2013-03-10 VITALS — BP 126/76 | HR 96 | Temp 98.0°F | Resp 16 | Ht 68.0 in | Wt 155.3 lb

## 2013-03-10 DIAGNOSIS — R29898 Other symptoms and signs involving the musculoskeletal system: Secondary | ICD-10-CM

## 2013-03-10 DIAGNOSIS — R531 Weakness: Secondary | ICD-10-CM

## 2013-03-10 DIAGNOSIS — M6289 Other specified disorders of muscle: Secondary | ICD-10-CM

## 2013-03-10 DIAGNOSIS — R5381 Other malaise: Secondary | ICD-10-CM

## 2013-03-10 DIAGNOSIS — D869 Sarcoidosis, unspecified: Secondary | ICD-10-CM

## 2013-03-10 DIAGNOSIS — R5383 Other fatigue: Secondary | ICD-10-CM

## 2013-03-10 LAB — C-REACTIVE PROTEIN: CRP: <0.5 mg/dL (ref <0.60)

## 2013-03-10 LAB — CORTISOL: Cortisol, Plasma: 8.8 ug/dL

## 2013-03-10 LAB — T4, FREE: Free T4: 0.99 ng/dL (ref 0.80–1.80)

## 2013-03-10 LAB — CK: Total CK: 96 U/L (ref 7–177)

## 2013-03-10 LAB — POCT SEDIMENTATION RATE: POCT SED RATE: 14 mm/h (ref 0–22)

## 2013-03-10 LAB — T3, FREE: T3, Free: 3 pg/mL (ref 2.3–4.2)

## 2013-03-10 LAB — RHEUMATOID FACTOR

## 2013-03-10 NOTE — Progress Notes (Signed)
[facility] [address], [place] [id number] [phone]  Date:  03/10/2013   Name:  Leslie ShadowJennifer E Huber   DOB:  1975/07/20   MRN:  [id number]  PCP:  Nilda SimmerSMITH,KRISTI, MD    Chief Complaint: Follow-up   History of Present Illness:  Leslie ShadowJennifer E Huber is a 38 y.o. very pleasant female patient who presents with the following:  Here today to follow-up from her visit 2 weeks ago: She notes tremor, shaking muscles and "feeling weak and tired." She does not really have a visible tremor but "it feels like I'm shaking."  She has noted this for about 3 days. It feels "like restless legs but all over."  She has had some hand tremor in the past- usually associated with not eating enough.  She takes cymbalta and klonopin prn. She has been on cymbalta for about 2.5 years  She is supposed to have an endometrial ablation soon due to menorrhagia and dysmenorrhea. This did have to be postponed for a family illness.  She does tend to have heavy menses. She is s/p BTL and does not think she is at risk of pregnancy  She has 3 children. She occasionally has some headaches- nothing out of the ordinary. No diarrhea. No fever  She does feel like she has to concentrated more to accomplish tasks than at her baseline  Overall she does not feel ill, does not feel like she has the flu   At that time her basic labs looked ok.  She is here today to discuss this more.   On further review she has noted muscle weakness for a longer time- not certain but maybe for a few months Holding her arms up to drive can be tiring.   She has a 10 acre property and walks long distances regularly.  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  She does feel that it is more the proximal muscles that are a problem.  Sometimes her right leg seems more weak than the left.   She will occasionally notice a tremor, or some muscle "twitches."  Once her legs twitched so  hard her knees "banged" together while she was driving her car.  She notes restless leg symptoms at night.  These sx are worse after prolonged standing.  Hydrating did help her- she feels better since she has been drinking gatorade. This has helped with the twitching in her muscles and her feeling of "brain fog."  She does note frequent headache, but she has had these for years.    About 2 years ago she had a period when the right side of her body felt weak for about 6 weeks.  The right side still seems to be a little bit weaker than the left at baseline  As far as her sarcoid, it was first found in her lungs.  Otherwise she may get "lumps in my muscles" but has not had any other known manifistations  She has lost 20- 25 lbs over the last 8 months. 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 Dysmenorrhea 01/22/2013  . Menorrhagia 01/22/2013  . Sarcoidosis 10/30/2012  . Fibromyalgia, secondary 10/30/2012  . Depression 10/30/2012    Past Medical History  Diagnosis Date  . Anemia   . Anxiety   . Depression   . Sarcoidosis 2010  affects joints, first seen in lungs  . Fibromyalgia, secondary 2013    in relation to Sarcoidosis  . Ulcer 1999    duodenum    Past Surgical History  Procedure Laterality Date  . Tubal ligation    . Hand tendon surgery Left     pinky, 5th digit  . Lymphnode Right 2006    pelvic area; benign    History  Substance Use Topics  . Smoking status: Never Smoker   . Smokeless tobacco: Never Used  . Alcohol Use: 0 - 1 oz/week    0-2 drink(s) per week    Family History  Problem Relation Age of Onset  . Thyroid disease Mother   . Sarcoidosis Mother   . Cancer Mother     SKIN  . Hypertension Father     Allergies  Allergen Reactions  . Sulfa Antibiotics Anaphylaxis    Medication list has been reviewed and updated.  Current Outpatient Prescriptions on File Prior to Visit   Medication Sig Dispense Refill  . clonazePAM (KLONOPIN) 0.5 MG disintegrating tablet Take 1 tablet (0.5 mg total) by mouth 2 (two) times daily as needed.  30 tablet  1  . DULoxetine (CYMBALTA) 60 MG capsule Take 2 capsules (120 mg total) by mouth daily.  180 capsule  3  . IRON PO Take 45 mg by mouth daily.      . naproxen sodium (ANAPROX) 220 MG tablet Take 220 mg by mouth 2 (two) times daily with a meal.       No current facility-administered medications on file prior to visit.    Review of Systems:  As per HPI- otherwise negative.   Physical Examination: Filed Vitals:   03/10/13 1036  BP: 126/76  Pulse: 96  Temp: 98 F (36.7 C)  Resp: 16   Filed Vitals:   03/10/13 1036  Height: 5\' 8"  (1.727 m)  Weight: 155 lb 4.8 oz (70.444 kg)   Body mass index is 23.62 kg/(m^2). Ideal Body Weight: Weight in (lb) to have BMI = 25: 164.1  GEN: WDWN, NAD, Non-toxic, A & O x 3, looks well, slim build HEENT: Atraumatic, Normocephalic. Neck supple. No masses, No LAD. Ears and Nose: No external deformity. CV: RRR, No M/G/R. No JVD. No thrill. No extra heart sounds. PULM: CTA B, no wheezes, crackles, rhonchi. No retractions. No resp. distress. No accessory muscle use. ABD: S, NT, ND, +BS. No rebound. No HSM. EXTR: No c/c/e NEURO Normal gait. Good strength of her extremities but she does seem to have minimally decreased strength right compared with left and normal DTR all extremities PSYCH: Normally interactive. Conversant. Not depressed or anxious appearing.  Calm demeanor.   EKG: NSR, no ST changes  [facility] reading (PRIMARY) by  Dr. Patsy Lager: CXR: negative   Assessment and Plan: Weakness - Plan: POCT SEDIMENTATION RATE, C-reactive protein, ANA, Rheumatoid factor, Anti-DNA antibody, double-stranded, T3, Free, T4, Free, EKG 12-Lead, Cortisol, DG Chest 2 View  Muscle fatigue - Plan: CK, DG Chest 2 View, CANCELED: Lactate Dehydrogenase  Sarcoidosis - Plan: DG Chest 2 View, DG Chest 2  View  Persistent weakness, tremors, muscle twitching.  Begin WU as above.  Will be in touch with her with her labs.  In any change in the meantime she is to let me know-Sooner if worse.     Signed Abbe Amsterdam, MD

## 2013-03-11 LAB — ANTI-DNA ANTIBODY, DOUBLE-STRANDED: DS DNA AB: 1 [IU]/mL

## 2013-03-11 LAB — ANA: Anti Nuclear Antibody(ANA): NEGATIVE

## 2013-03-17 NOTE — Addendum Note (Signed)
Addended by: Abbe AmsterdamOPLAND, Kristian Mogg C on: 03/17/2013 01:41 PM   Modules accepted: Orders

## 2013-03-25 ENCOUNTER — Encounter: Payer: Self-pay | Admitting: Diagnostic Neuroimaging

## 2013-03-25 ENCOUNTER — Ambulatory Visit (INDEPENDENT_AMBULATORY_CARE_PROVIDER_SITE_OTHER): Payer: BC Managed Care – PPO | Admitting: Diagnostic Neuroimaging

## 2013-03-25 VITALS — BP 105/65 | HR 68 | Temp 97.7°F | Ht 69.0 in | Wt 159.0 lb

## 2013-03-25 DIAGNOSIS — M6281 Muscle weakness (generalized): Secondary | ICD-10-CM

## 2013-03-25 DIAGNOSIS — R531 Weakness: Secondary | ICD-10-CM

## 2013-03-25 DIAGNOSIS — R209 Unspecified disturbances of skin sensation: Secondary | ICD-10-CM

## 2013-03-25 DIAGNOSIS — D869 Sarcoidosis, unspecified: Secondary | ICD-10-CM

## 2013-03-25 DIAGNOSIS — D860 Sarcoidosis of lung: Secondary | ICD-10-CM

## 2013-03-25 DIAGNOSIS — R200 Anesthesia of skin: Secondary | ICD-10-CM

## 2013-03-25 DIAGNOSIS — J99 Respiratory disorders in diseases classified elsewhere: Secondary | ICD-10-CM

## 2013-03-25 NOTE — Patient Instructions (Signed)
I will check MRI brain.  Consider establishing with local pulmonologist.

## 2013-03-25 NOTE — Telephone Encounter (Signed)
Pt see by neurology today.  I think this phone note is getting send back in error

## 2013-03-25 NOTE — Progress Notes (Signed)
[facility]  PATIENT: Leslie Huber DOB: 12/02/1975  REFERRING CLINICIAN: Copeland HISTORY FROM: patient  REASON FOR VISIT: new consult   HISTORICAL  CHIEF COMPLAINT:  Chief Complaint  Patient presents with  . Fatigue    R side    HISTORY OF PRESENT ILLNESS:   38 year old right-handed female with history of sarcoidosis, here for evaluation of right-sided numbness and weakness.  2009 patient had cough, chest x-ray which showed some pulmonary nodules. Skin raise possibility of sarcoidosis. ACE level was checked and abnormal. 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 Patient's family history sarcoidosis in her mother. Patient was offered treatment with steroids the patient opted to observe symptoms. Patient's cough symptoms resolved. She did not have any breathing abnormalities on pulmonary function testing.  [place] Over the past one year she has had intermittent episodes of right face, arm, leg numbness and tingling. These are intermittent lasting days or one week a time. These happen roughly 2 times per year. She has mild episodes occur every 3-6 months.  Patient also has intermittent short-term memory problems.  Patient has been diagnosed with left carpal tunnel syndrome in the past.   REVIEW OF SYSTEMS: Full 14 system review of systems performed and notable only for fatigue palpitations right leg swelling itching constipation shortness of breath blurred vision easy bruising increased her throbbing aching muscle depression anxiety decreased energy racing thoughts restless legs.  ALLERGIES: Allergies  Allergen Reactions  . Sulfa Antibiotics Anaphylaxis    HOME MEDICATIONS: Outpatient Prescriptions Prior to Visit  Medication Sig Dispense Refill  . clonazePAM (KLONOPIN) 0.5 MG disintegrating tablet Take 1 tablet (0.5 mg total) by mouth 2 (two) times daily as needed.  30 tablet  1  . DULoxetine  (CYMBALTA) 60 MG capsule Take 2 capsules (120 mg total) by mouth daily.  180 capsule  3  . IRON PO Take 45 mg by mouth daily.      . naproxen sodium (ANAPROX) 220 MG tablet Take 220 mg by mouth 2 (two) times daily with a meal.       No facility-administered medications prior to visit.    PAST MEDICAL HISTORY: Past Medical History  Diagnosis Date  . Anemia   . Anxiety   . Depression   . 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 Fibromyalgia, secondary 2013    in relation to Sarcoidosis  . Ulcer 1999    duodenum  . Hypotension   . Ovarian cyst   . Migraine     PAST SURGICAL HISTORY: Past Surgical History  Procedure Laterality Date  . Tubal ligation    . Hand tendon surgery Left     pinky, 5th digit  . Lymphnode Right 2006    pelvic area; benign    FAMILY HISTORY: Family History  Problem Relation Age of Onset  . Thyroid disease Mother   . Sarcoidosis Mother   . Cancer Mother     SKIN  . Hypertension Father     SOCIAL HISTORY:  History   Social History  . Marital Status: Married    Spouse Name: Creola CornMike Blackerby    Number of Children: 3  . Years of Education: college   Occupational History  . former Museum/gallery conservatorvet tech   . farm   . Stay-at-Home Mom    Social History Main Topics  . Smoking status: Never Smoker   . Smokeless tobacco: Never Used  . Alcohol Use: 0 - 1  oz/week    0-2 drink(s) per week     Comment: occasional; 1 drink a week  . Drug Use: No  . Sexual Activity: Yes    Partners: Male    Birth Control/ Protection: Surgical   Other Topics Concern  . 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   Caffeine Use: 1 cup of coffee     PHYSICAL EXAM  Filed Vitals:   03/25/13 1023  BP: 105/65  Pulse: 68  Temp: 97.7 F (36.5 C)  TempSrc: Oral  Height: 5\' 9"  (1.753 m)  Weight: 159 lb (72.122 kg)    Not recorded    Body mass index is 23.47  kg/(m^2).  GENERAL EXAM: Patient is in no distress; well developed, nourished and groomed; neck is supple  CARDIOVASCULAR: Regular rate and rhythm, no murmurs, no carotid bruits  NEUROLOGIC: MENTAL STATUS: awake, alert, oriented to person, place and time, recent and remote memory intact, normal attention and concentration, language fluent, comprehension intact, naming intact, fund of knowledge appropriate CRANIAL NERVE: no papilledema on fundoscopic exam, pupils equal and reactive to light, DECR LIGHT SENS IN RIGHT EYE, visual fields full to confrontation, extraocular muscles intact, no nystagmus, facial sensation and strength symmetric, hearing intact, palate elevates symmetrically, uvula midline, shoulder shrug symmetric, tongue midline. MOTOR: normal bulk and tone, full strength in the BUE AND LLE; RLE 4+. SENSORY: normal and symmetric to light touch, pinprick, temperature, vibration COORDINATION: finger-nose-finger, fine finger movements normal REFLEXES: deep tendon reflexes present and symmetric GAIT/STATION: narrow based gait; able to walk on toes, heels and tandem; romberg is negative    DIAGNOSTIC DATA (LABS, IMAGING, TESTING) - I reviewed patient records, labs, notes, testing and imaging myself where available.  Lab Results  Component Value Date   WBC 8.8 02/25/2013   HGB 12.0* 02/25/2013   HCT 40.1 02/25/2013   MCV 85.2 02/25/2013      Component Value Date/Time   NA 138 02/25/2013 1321   K 4.2 02/25/2013 1321   CL 102 02/25/2013 1321   CO2 30 02/25/2013 1321   GLUCOSE 78 02/25/2013 1321   BUN 13 02/25/2013 1321   CREATININE 0.81 02/25/2013 1321   CALCIUM 9.7 02/25/2013 1321   PROT 7.6 02/25/2013 1321   ALBUMIN 4.5 02/25/2013 1321   AST 19 02/25/2013 1321   ALT 11 02/25/2013 1321   ALKPHOS 62 02/25/2013 1321   BILITOT 0.3 02/25/2013 1321   Lab Results  Component Value Date   CHOL 180 12/16/2012   No results found for this basename: HGBA1C   No results found for this  basename: VITAMINB12   Lab Results  Component Value Date   TSH 1.707 02/25/2013    11/28/11 MRI CERVICAL - Relatively minor disc disease as described at C3-4, C4-5, and C6-7.  There is no spinal stenosis or nerve root encroachment.   ASSESSMENT AND PLAN  38 y.o. year old female here with intermittent right face, arm, leg numbness and tingling for past one year. Some intermittent short-term memory problems. Exam notable for decreased light sensitivity in the right eye and mild right lower extremity weakness. Also with remote history/diagnosis of pulmonary sarcoidosis.  Ddx: CNS autoimmune, inflamm, neurosarcoid, upper cervical spine disease  PLAN: - Check MRI brain for further evaluation - Consider establishing with local pulmonologist to followup on prior pulmonary sarcoidosis diagnosis   Orders Placed This Encounter  Procedures  .  MR Brain W Wo Contrast   Return in about 3 months (around 06/22/2013).    Suanne Marker, MD 03/25/2013, 11:05 AM Certified in Neurology, Neurophysiology and Neuroimaging  [facility] [address], [address] [place], [place] [address] [phone]

## 2013-03-27 ENCOUNTER — Ambulatory Visit (INDEPENDENT_AMBULATORY_CARE_PROVIDER_SITE_OTHER): Payer: BC Managed Care – PPO

## 2013-03-27 DIAGNOSIS — M6281 Muscle weakness (generalized): Secondary | ICD-10-CM

## 2013-03-27 DIAGNOSIS — D860 Sarcoidosis of lung: Secondary | ICD-10-CM

## 2013-03-27 DIAGNOSIS — R531 Weakness: Secondary | ICD-10-CM

## 2013-03-27 DIAGNOSIS — J99 Respiratory disorders in diseases classified elsewhere: Secondary | ICD-10-CM

## 2013-03-27 DIAGNOSIS — D869 Sarcoidosis, unspecified: Secondary | ICD-10-CM

## 2013-03-27 DIAGNOSIS — R209 Unspecified disturbances of skin sensation: Secondary | ICD-10-CM

## 2013-03-27 DIAGNOSIS — R200 Anesthesia of skin: Secondary | ICD-10-CM

## 2013-03-27 MED ORDER — GADOPENTETATE DIMEGLUMINE 469.01 MG/ML IV SOLN: 15.0000 mL | Freq: Once | INTRAVENOUS | Status: AC | PRN

## 2013-04-07 ENCOUNTER — Telehealth: Payer: Self-pay | Admitting: Diagnostic Neuroimaging

## 2013-04-07 NOTE — Telephone Encounter (Signed)
Called patient and left message concerning her MRI being normal and if she has any other problems, questions or concerns to call the office.

## 2013-04-07 NOTE — Telephone Encounter (Signed)
Pt called is wanting someone to return her call concerning her MRI results.

## 2013-04-21 ENCOUNTER — Encounter: Payer: Self-pay | Admitting: Family Medicine

## 2013-04-21 ENCOUNTER — Ambulatory Visit (INDEPENDENT_AMBULATORY_CARE_PROVIDER_SITE_OTHER): Payer: BC Managed Care – PPO | Admitting: Family Medicine

## 2013-04-21 VITALS — BP 116/69 | HR 82 | Temp 98.6°F | Resp 16 | Ht 68.5 in | Wt 157.4 lb

## 2013-04-21 DIAGNOSIS — IMO0001 Reserved for inherently not codable concepts without codable children: Secondary | ICD-10-CM

## 2013-04-21 DIAGNOSIS — L919 Hypertrophic disorder of the skin, unspecified: Secondary | ICD-10-CM

## 2013-04-21 DIAGNOSIS — M791 Myalgia, unspecified site: Secondary | ICD-10-CM

## 2013-04-21 DIAGNOSIS — L918 Other hypertrophic disorders of the skin: Secondary | ICD-10-CM

## 2013-04-21 DIAGNOSIS — D869 Sarcoidosis, unspecified: Secondary | ICD-10-CM

## 2013-04-21 DIAGNOSIS — L909 Atrophic disorder of skin, unspecified: Secondary | ICD-10-CM

## 2013-04-21 DIAGNOSIS — G4700 Insomnia, unspecified: Secondary | ICD-10-CM

## 2013-04-21 DIAGNOSIS — Z23 Encounter for immunization: Secondary | ICD-10-CM

## 2013-04-21 MED ORDER — CYCLOBENZAPRINE HCL 10 MG PO TABS: ORAL_TABLET | ORAL | Status: DC

## 2013-04-21 NOTE — Progress Notes (Signed)
[facility] [address], [place] [address] [phone]  Date:  04/21/2013   Name:  Leslie ShadowJennifer E Dyk   DOB:  Mar 19, 1975   MRN:  [id number]  PCP:  Nilda SimmerSMITH,KRISTI, MD    Chief Complaint: Follow-up   History of Present Illness:  Leslie Huber is a 38 y.o. very pleasant female patient who presents with the following:  She is here today to follow-up. She did see neurology and had a negative MRI.  From a neurological standpoint all seems to be well- there was no particular pathology that her neurologist could fine.  There is some concern that sarcoid in her lungs could still be an issue; she would like a referral to pulmonology  She feels that her sx are getting overall better.    Her weakness, fatigue is getting better.  She is still not sure of the cause but is pleased that she is improving.  She has noted a hard time getting to sleep here recently; staying asleep is not an issue. Her legs and back will feel tight, and she will "toss and turn," has a hard time getting comfortable  She has tried some benadryl, other OTC medications.  Not very helpful so far.    She really is not sure about her last tetanus shot but thinks she is overdue.  Would like to update today.  She does not think she has had a Tdap as of yet.    She also notes a skin tag under her left eye that she would like to have removed  Patient Active Problem List   Diagnosis Date Noted  . Right sided weakness 03/25/2013  . Numbness on right side 03/25/2013  . 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 Dysmenorrhea 01/22/2013  . Menorrhagia 01/22/2013  . Sarcoidosis 10/30/2012  . Fibromyalgia, secondary 10/30/2012  . Depression 10/30/2012    Past Medical History  Diagnosis Date  . Anemia   . Anxiety   . Depression   . 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 Fibromyalgia, secondary 2013    in relation to Sarcoidosis  . Ulcer 1999    duodenum  . Hypotension   . Ovarian cyst    . Migraine     Past Surgical History  Procedure Laterality Date  . Tubal ligation    . Hand tendon surgery Left     pinky, 5th digit  . Lymphnode Right 2006    pelvic area; benign    History  Substance Use Topics  . Smoking status: Never Smoker   . Smokeless tobacco: Never Used  . Alcohol Use: 0 - 1 oz/week    0-2 drink(s) per week     Comment: occasional; 1 drink a week    Family History  Problem Relation Age of Onset  . Thyroid disease Mother   . Sarcoidosis Mother   . Cancer Mother     SKIN  . Hypertension Father     Allergies  Allergen Reactions  . Sulfa Antibiotics Anaphylaxis    Medication list has been reviewed and updated.  Current Outpatient Prescriptions on File Prior to Visit  Medication Sig Dispense Refill  . clonazePAM (KLONOPIN) 0.5 MG disintegrating tablet Take 1 tablet (0.5 mg total) by mouth 2 (two) times daily as needed.  30 tablet  1  . DULoxetine (CYMBALTA) 60 MG capsule Take 2 capsules (120 mg total) by mouth daily.  180 capsule  3  . IRON PO Take 45  mg by mouth daily.      . naproxen sodium (ANAPROX) 220 MG tablet Take 220 mg by mouth 2 (two) times daily with a meal.       No current facility-administered medications on file prior to visit.    Review of Systems:  As per HPI- otherwise negative.   Physical Examination: Filed Vitals:   04/21/13 1118  BP: 116/69  Pulse: 82  Temp: 98.6 F (37 C)  Resp: 16   Filed Vitals:   04/21/13 1118  Height: 5' 8.5" (1.74 m)  Weight: 157 lb 6.4 oz (71.396 kg)   Body mass index is 23.58 kg/(m^2). Ideal Body Weight: Weight in (lb) to have BMI = 25: 166.5  GEN: WDWN, NAD, Non-toxic, A & O x 3, looks well, cheerful today Tiny skin tag under the left eye, about 1cm from the lower lid HEENT: Atraumatic, Normocephalic. Neck supple. No masses, No LAD. Ears and Nose: No external deformity. CV: RRR, No M/G/R. No JVD. No thrill. No extra heart sounds. PULM: CTA B, no wheezes, crackles, rhonchi.  No retractions. No resp. distress. No accessory muscle use. EXTR: No c/c/e NEURO Normal gait.  PSYCH: Normally interactive. Conversant. Not depressed or anxious appearing.  Calm demeanor.   VC obtained.  Area prepped with betadine and alcohol.  Tiny skin tag under left eye removed with iris scissors.  No complications, 0ml blood loss.   Assessment and Plan: Muscle pain - Plan: cyclobenzaprine (FLEXERIL) 10 MG tablet  Insomnia  Immunization due - Plan: Td vaccine greater than or equal to 7yo preservative free IM  Sarcoidosis - Plan: Ambulatory referral to Pulmonology  Skin tag  May try flexeril as needed before bed as muscular discomfort seems to contribute to her insomnia.  Do not combine with klonopin. Discussed increased risk of NMS with cymbalta- this is rare and she is ok with risk Td today- does need a tdap but will defer until her neurological issues are resolved.    Signed Abbe Amsterdam, MD

## 2013-04-21 NOTE — Patient Instructions (Signed)
Tetanus, Diphtheria (Td) Vaccine What You Need to Know WHY GET VACCINATED? Tetanus  and diphtheria are very serious diseases. They are rare in the [place] today, but people who do become infected often have severe complications. Td vaccine is used to protect adolescents and adults from both of these diseases. Both tetanus and diphtheria are infections caused by bacteria. Diphtheria spreads from person to person through coughing or sneezing. Tetanus-causing bacteria enter the body through cuts, scratches, or wounds. TETANUS (Lockjaw) causes painful muscle tightening and stiffness, usually all over the body.  It can lead to tightening of muscles in the head and neck so you can't open your mouth, swallow, or sometimes even breathe. Tetanus kills about 1 out of every 5 people who are infected. DIPHTHERIA can cause a thick coating to form in the back of the throat.  It can lead to breathing problems, paralysis, heart failure, and death. Before vaccines, the [place] saw as many as 200,000 cases a year of diphtheria and hundreds of cases of tetanus. Since vaccination began, cases of both diseases have dropped by about 99%. TD VACCINE Td vaccine can protect adolescents and adults from tetanus and diphtheria. Td is usually given as a booster dose every 10 years but it can also be given earlier after a severe and dirty wound or burn. Your doctor can give you more information. Td may safely be given at the same time as other vaccines. SOME PEOPLE SHOULD NOT GET THIS VACCINE  If you ever had a life-threatening allergic reaction after a dose of any tetanus or diphtheria containing vaccine, OR if you have a severe allergy to any part of this vaccine, you should not get Td. Tell your doctor if you have any severe allergies.  Talk to your doctor if you:  have epilepsy or another nervous system problem,  had severe pain or swelling after any vaccine containing diphtheria or tetanus,  ever had  Guillain Barr Syndrome (GBS),  aren't feeling well on the day the shot is scheduled. RISKS OF A VACCINE REACTION With a vaccine, like any medicine, there is a chance of side effects. These are usually mild and go away on their own. Serious side effects are also possible, but are very rare. Most people who get Td vaccine do not have any problems with it. Mild Problems  following Td (Did not interfere with activities)  Pain where the shot was given (about 8 people in 10)  Redness or swelling where the shot was given (about 1 person in 3)  Mild fever (about 1 person in 15)  Headache or Tiredness (uncommon) Moderate Problems following Td (Interfered with activities, but did not require medical attention)  Fever over 102 F (38.9 C) (rare) Severe Problems  following Td (Unable to perform usual activities; required medical attention)  Swelling, severe pain, bleeding, or redness in the arm where the shot was given (rare). Problems that could happen after any vaccine:  Brief fainting spells can happen after any medical procedure, including vaccination. Sitting or lying down for about 15 minutes can help prevent fainting, and injuries caused by a fall. Tell your doctor if you feel dizzy, or have vision changes or ringing in the ears.  Severe shoulder pain and reduced range of motion in the arm where a shot was given can happen, very rarely, after a vaccination.  Severe allergic reactions from a vaccine are very rare, estimated at less than 1 in a million doses. If one were to occur, it would   usually be within a few minutes to a few hours after the vaccination. WHAT IF THERE IS A SERIOUS REACTION? What should I look for?  Look for anything that concerns you, such as signs of a severe allergic reaction, very high fever, or behavior changes. Signs of a severe allergic reaction can include hives, swelling of the face and throat, difficulty breathing, a fast heartbeat, dizziness, and  weakness. These would usually start a few minutes to a few hours after the vaccination. What should I do?  If you think it is a severe allergic reaction or other emergency that can't wait, call 911 or get the person to the nearest hospital. Otherwise, call your doctor.  Afterward, the reaction should be reported to the Vaccine Adverse Event Reporting System (VAERS). Your doctor might file this report, or, you can do it yourself through the VAERS website or by calling 1-800-822-7967. VAERS is only for reporting reactions. They do not give medical advice. THE NATIONAL VACCINE INJURY COMPENSATION PROGRAM The National Vaccine Injury Compensation Program (VICP) is a federal program that was created to compensate people who may have been injured by certain vaccines. Persons who believe they may have been injured by a vaccine can learn about the program and about filing a claim by calling 1-800-338-2382 or visiting the VICP website. HOW CAN I LEARN MORE?  Ask your doctor.  Contact your local or state health department.  Contact the Centers for Disease Control and Prevention (CDC):  Call 1-800-232-4636 (1-800-CDC-INFO)  Visit CDC's vaccines website CDC Td Vaccine Interim VIS (03/19/12) Document Released: 11/27/2005 Document Revised: 05/27/2012 Document Reviewed: 05/22/2012 Exit[employer] Patient Information 2014 [employer].  

## 2013-05-05 ENCOUNTER — Encounter: Payer: Self-pay | Admitting: Pulmonary Disease

## 2013-05-05 ENCOUNTER — Ambulatory Visit (INDEPENDENT_AMBULATORY_CARE_PROVIDER_SITE_OTHER): Payer: BC Managed Care – PPO | Admitting: Pulmonary Disease

## 2013-05-05 VITALS — BP 132/76 | HR 80 | Ht 70.0 in | Wt 161.0 lb

## 2013-05-05 DIAGNOSIS — D869 Sarcoidosis, unspecified: Secondary | ICD-10-CM

## 2013-05-05 NOTE — Progress Notes (Signed)
Subjective:    Patient ID: Leslie Huber, female    DOB: 01-20-1976, 38 y.o.   MRN: [id number]  HPI  Leslie Huber is here to see me for possible sarcoidosis.  In 2009 she was supposed to have a D&C and she had an abnormal chest x-ray.  She saw a pulmonologist and ended up had "a bunch of blood work but no biopsy" and was told that she had sarcoidosis.  She says that her mother and uncle have sarcoidosis.  She had no breathng problems at the time.  She has attributed joint and muscle pain to her sarcoidosis.  She notes that rarely she will have some shortness of breath or chest tightness but it really doesn't bother her much.  She sometimes feels like she is not getting enough oxygen or getting in enough oxygen.  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  Sometimes if she has to chase an animal by running she will get more short of breath.  She occasionally has a coughing fit, but it is rarely productive. It doesn't happen too often.    Past Medical History  Diagnosis Date  . Anemia   . Anxiety   . Depression   . 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 Fibromyalgia, secondary 2013    in relation to Sarcoidosis  . Ulcer 1999    duodenum  . Hypotension   . Ovarian cyst   . Migraine      Family History  Problem Relation Age of Onset  . Thyroid disease Mother   . Sarcoidosis Mother   . Cancer Mother     SKIN  . Hypertension Father      History   Social History  . Marital Status: Married    Spouse Name: Leslie Huber    Number of Children: 3  . Years of Education: college   Occupational History  . former Museum/gallery conservator   . farm   . Stay-at-Home Mom    Social History Main Topics  . Smoking status: Never Smoker   . Smokeless tobacco: Never Used  . Alcohol Use: 0 - 1 oz/week    0-2  drink(s) per week     Comment: occasional; 1 drink a week  . Drug Use: No  . Sexual Activity: Yes    Partners: Male    Birth Control/ Protection: Surgical   Other Topics Concern  . 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   Caffeine Use: 1 cup of coffee     Allergies  Allergen Reactions  . Sulfa Antibiotics Anaphylaxis     Outpatient Prescriptions Prior to Visit  Medication Sig Dispense Refill  . clonazePAM (KLONOPIN) 0.5 MG disintegrating tablet Take 1 tablet (0.5 mg total) by mouth 2 (two) times daily as needed.  30 tablet  1  . cyclobenzaprine (FLEXERIL) 10 MG tablet Take a 1/2 or 1 at bedtime as needed for muscle pain  30 tablet  3  . DULoxetine (CYMBALTA) 60 MG capsule Take 2 capsules (120 mg total) by mouth daily.  180 capsule  3  . IRON PO Take 45 mg by mouth daily.      Marland Kitchen  Multiple Vitamins-Minerals (CORAL CALCIUM PLUS PO) Take by mouth.      . naproxen sodium (ANAPROX) 220 MG tablet Take 220 mg by mouth 2 (two) times daily with a meal.       No facility-administered medications prior to visit.      Review of Systems  Constitutional: Negative for fever and unexpected weight change.  HENT: Positive for dental problem. Negative for congestion, ear pain, nosebleeds, postnasal drip, rhinorrhea, sinus pressure, sneezing, sore throat and trouble swallowing.   Eyes: Negative for redness and itching.  Respiratory: Positive for chest tightness and shortness of breath. Negative for cough and wheezing.   Cardiovascular: Negative for palpitations and leg swelling.  Gastrointestinal: Negative for nausea and vomiting.  Genitourinary: Negative for dysuria.  Musculoskeletal: Positive for joint swelling.  Skin: Negative for rash.  Neurological: Positive for headaches.  Hematological: Does not bruise/bleed easily.  Psychiatric/Behavioral: Positive for dysphoric mood. The patient is  nervous/anxious.        Objective:   Physical Exam Filed Vitals:   05/05/13 1000  BP: 132/76  Pulse: 80  Height: 5\' 10"  (1.778 m)  Weight: 161 lb (73.029 kg)  SpO2: 100%   Gen: well appearing, no acute distress HEENT: NCAT, PERRL, EOMi, OP clear, neck supple without masses PULM: CTA B CV: RRR, no mgr, no JVD AB: BS+, soft, nontender, no hsm Ext: warm, no edema, no clubbing, no cyanosis Derm: no rash or skin breakdown Neuro: A&Ox4, CN II-XII intact, strength 5/5 in all 4 extremities  02/2013 CXR images reviewed by me> normal lung parenchyma, no hilar lymphadenopathy      Assessment & Plan:   Sarcoidosis Ms. Leslie Huber does not have sarcoidosis.  This condition is diagnosed with a tissue biopsy which demonstrates non-caseating granulomas in the setting of no concomitant infection.  She has never had a biopsy.  Further, she really has no respiratory complaints other than fatigue on exertion which is very non-specific.  She has a normal Chest X-ray so there is no evidence of pulmonary sarcoidosis.  It is possible I suppose that she has a rare manifestation of the disease, but I don't see any evidence or worrisome features for that today.  I explained to her that if she develops shortness of breath or cough that is otherwise unexplained I am happy to see her again.  Follow up with me PRN.   Updated Medication List Outpatient Encounter Prescriptions as of 05/05/2013  Medication Sig  . clonazePAM (KLONOPIN) 0.5 MG disintegrating tablet Take 1 tablet (0.5 mg total) by mouth 2 (two) times daily as needed.  . cyclobenzaprine (FLEXERIL) 10 MG tablet Take a 1/2 or 1 at bedtime as needed for muscle pain  . DULoxetine (CYMBALTA) 60 MG capsule Take 2 capsules (120 mg total) by mouth daily.  . IRON PO Take 45 mg by mouth daily.  . Multiple Vitamins-Minerals (CORAL CALCIUM PLUS PO) Take by mouth.  . naproxen sodium (ANAPROX) 220 MG tablet Take 220 mg by mouth 2 (two) times daily with a meal.

## 2013-05-05 NOTE — Patient Instructions (Signed)
Follow up with us if you develop respiratory problems (worsening shortness of breath, cough, etc).

## 2013-05-05 NOTE — Assessment & Plan Note (Addendum)
Ms. Leslie Huber does not have sarcoidosis.  This condition is diagnosed with a tissue biopsy which demonstrates non-caseating granulomas in the setting of no concomitant infection.  She has never had a biopsy.  Further, she really has no respiratory complaints other than fatigue on exertion which is very non-specific.  She has a normal Chest X-ray so there is no evidence of pulmonary sarcoidosis.  It is possible I suppose that she has a rare manifestation of the disease, but I don't see any evidence or worrisome features for that today.  I explained to her that if she develops shortness of breath or cough that is otherwise unexplained I am happy to see her again.  Follow up with me PRN.

## 2013-06-26 ENCOUNTER — Ambulatory Visit: Payer: BC Managed Care – PPO | Admitting: Diagnostic Neuroimaging

## 2013-08-27 ENCOUNTER — Ambulatory Visit (INDEPENDENT_AMBULATORY_CARE_PROVIDER_SITE_OTHER): Payer: BC Managed Care – PPO | Admitting: Emergency Medicine

## 2013-08-27 VITALS — BP 118/72 | HR 86 | Temp 97.9°F | Resp 18 | Ht 68.0 in | Wt 167.0 lb

## 2013-08-27 DIAGNOSIS — R300 Dysuria: Secondary | ICD-10-CM

## 2013-08-27 DIAGNOSIS — N3000 Acute cystitis without hematuria: Secondary | ICD-10-CM

## 2013-08-27 LAB — POCT UA - MICROSCOPIC ONLY
Casts, Ur, LPF, POC: NEGATIVE
Crystals, Ur, HPF, POC: NEGATIVE
Mucus, UA: NEGATIVE
YEAST UA: NEGATIVE

## 2013-08-27 LAB — POCT URINALYSIS DIPSTICK
Bilirubin, UA: NEGATIVE
GLUCOSE UA: NEGATIVE
Ketones, UA: NEGATIVE
NITRITE UA: NEGATIVE
Protein, UA: NEGATIVE
SPEC GRAV UA: 1.015
UROBILINOGEN UA: 0.2
pH, UA: 5

## 2013-08-27 MED ORDER — CIPROFLOXACIN HCL 250 MG PO TABS: 250.0000 mg | ORAL_TABLET | Freq: Two times a day (BID) | ORAL | Status: DC

## 2013-08-27 MED ORDER — PHENAZOPYRIDINE HCL 200 MG PO TABS: 200.0000 mg | ORAL_TABLET | Freq: Three times a day (TID) | ORAL | Status: DC | PRN

## 2013-08-27 NOTE — Progress Notes (Signed)
[facility] [address], [place] [address] [phone]  Date:  08/27/2013   Name:  Leslie ShadowJennifer E Wrigley   DOB:  25-Nov-1975   MRN:  [id number]  PCP:  Nilda SimmerSMITH,KRISTI, MD    Chief Complaint: Abdominal Pain and Dysuria   History of Present Illness:  Leslie Huber is a 38 y.o. very pleasant female patient who presents with the following:  Patient has pain in pelvis that has moved into her lower abdomen and back.  Pelvic pain started 2 weeks and was normal premenstrual pain.  Period was not remarkable.  By Saturday the pain was worse.  No discharge other than usual vaginal menstrual bleeding.  Had a BTL.  [id number]. Has no dysuria or urgency.  Has difficulty initiating stream.  History or dysmenorrhea.  No GI symptoms.  No improvement with over the counter medications or other home remedies. Denies other complaint or health concern today.   Patient Active Problem List   Diagnosis Date Noted  . Right sided weakness 03/25/2013  . Numbness on right side 03/25/2013  . 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 Dysmenorrhea 01/22/2013  . Menorrhagia 01/22/2013  . Sarcoidosis 10/30/2012  . Fibromyalgia, secondary 10/30/2012  . Depression 10/30/2012    Past Medical History  Diagnosis Date  . Anemia   . Anxiety   . Depression   . 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 Fibromyalgia, secondary 2013    in relation to Sarcoidosis  . Ulcer 1999    duodenum  . Hypotension   . Ovarian cyst   . Migraine     Past Surgical History  Procedure Laterality Date  . Tubal ligation    . Hand tendon surgery Left     pinky, 5th digit  . Lymphnode Right 2006    pelvic area; benign    History  Substance Use Topics  . Smoking status: Never Smoker   . Smokeless tobacco: Never Used  . Alcohol Use: 0.0 - 1.0 oz/week    0-2 drink(s) per week     Comment: occasional; 1 drink a week    Family History  Problem Relation Age of Onset  . Thyroid disease Mother    . Sarcoidosis Mother   . Cancer Mother     SKIN  . Hypertension Father     Allergies  Allergen Reactions  . Sulfa Antibiotics Anaphylaxis    Medication list has been reviewed and updated.  Current Outpatient Prescriptions on File Prior to Visit  Medication Sig Dispense Refill  . clonazePAM (KLONOPIN) 0.5 MG disintegrating tablet Take 1 tablet (0.5 mg total) by mouth 2 (two) times daily as needed.  30 tablet  1  . cyclobenzaprine (FLEXERIL) 10 MG tablet Take a 1/2 or 1 at bedtime as needed for muscle pain  30 tablet  3  . DULoxetine (CYMBALTA) 60 MG capsule Take 2 capsules (120 mg total) by mouth daily.  180 capsule  3  . IRON PO Take 45 mg by mouth daily.      . Multiple Vitamins-Minerals (CORAL CALCIUM PLUS PO) Take by mouth.      . naproxen sodium (ANAPROX) 220 MG tablet Take 220 mg by mouth 2 (two) times daily with a meal.       No current facility-administered medications on file prior to visit.    Review of Systems:  As per HPI, otherwise negative.    Physical Examination: Filed Vitals:   08/27/13 1632  BP: 118/72  Pulse: 86  Temp: 97.9 F (36.6 C)  Resp: 18   Filed Vitals:   08/27/13 1632  Height: 5\' 8"  (1.727 m)  Weight: 167 lb (75.751 kg)   Body mass index is 25.4 kg/(m^2). Ideal Body Weight: Weight in (lb) to have BMI = 25: 164.1   GEN: WDWN, NAD, Non-toxic, Alert & Oriented x 3 HEENT: Atraumatic, Normocephalic.  Ears and Nose: No external deformity. EXTR: No clubbing/cyanosis/edema NEURO: Normal gait.  PSYCH: Normally interactive. Conversant. Not depressed or anxious appearing.  Calm demeanor.  Abdomen:  Tender RLQ with direct and referred rebound.  Assessment and Plan: Cystitis Pelvic pain Septra Pyridium   Signed,  Phillips Odor, MD  Results for orders placed in visit on 08/27/13  POCT UA - MICROSCOPIC ONLY      Result Value Ref Range   WBC, Ur, HPF, POC 0-2     RBC, urine, microscopic 1-4     Bacteria, U Microscopic trace      Mucus, UA neg     Epithelial cells, urine per micros 1-4     Crystals, Ur, HPF, POC neg     Casts, Ur, LPF, POC neg     Yeast, UA neg    POCT URINALYSIS DIPSTICK      Result Value Ref Range   Color, UA yellow     Clarity, UA cloudy     Glucose, UA neg     Bilirubin, UA neg     Ketones, UA neg     Spec Grav, UA 1.015     Blood, UA moderate     pH, UA 5.0     Protein, UA neg     Urobilinogen, UA 0.2     Nitrite, UA neg     Leukocytes, UA Trace

## 2013-09-01 ENCOUNTER — Ambulatory Visit (HOSPITAL_COMMUNITY)
Admission: RE | Admit: 2013-09-01 | Discharge: 2013-09-01 | Disposition: A | Discharge status: Home / Self Care | Payer: BC Managed Care – PPO | Source: Ambulatory Visit | Attending: Family Medicine | Admitting: Family Medicine

## 2013-09-01 ENCOUNTER — Ambulatory Visit (INDEPENDENT_AMBULATORY_CARE_PROVIDER_SITE_OTHER): Payer: BC Managed Care – PPO | Admitting: Family Medicine

## 2013-09-01 VITALS — BP 116/72 | HR 89 | Temp 97.2°F | Resp 16 | Ht 68.75 in | Wt 171.4 lb

## 2013-09-01 DIAGNOSIS — K5900 Constipation, unspecified: Secondary | ICD-10-CM

## 2013-09-01 DIAGNOSIS — R1031 Right lower quadrant pain: Secondary | ICD-10-CM

## 2013-09-01 DIAGNOSIS — R599 Enlarged lymph nodes, unspecified: Secondary | ICD-10-CM | POA: Insufficient documentation

## 2013-09-01 LAB — POCT UA - MICROSCOPIC ONLY
Bacteria, U Microscopic: NEGATIVE
Casts, Ur, LPF, POC: NEGATIVE
Crystals, Ur, HPF, POC: NEGATIVE
Epithelial cells, urine per micros: NEGATIVE
Mucus, UA: NEGATIVE
RBC, URINE, MICROSCOPIC: NEGATIVE
WBC, UR, HPF, POC: NEGATIVE
Yeast, UA: NEGATIVE

## 2013-09-01 LAB — CBC WITH DIFFERENTIAL/PLATELET
BASOS ABS: 0 10*3/uL (ref 0.0–0.1)
BASOS PCT: 0 % (ref 0–1)
EOS ABS: 0.2 10*3/uL (ref 0.0–0.7)
Eosinophils Relative: 2 % (ref 0–5)
HEMATOCRIT: 37 % (ref 36.0–46.0)
HEMOGLOBIN: 12.1 g/dL (ref 12.0–15.0)
Lymphocytes Relative: 28 % (ref 12–46)
Lymphs Abs: 2.2 10*3/uL (ref 0.7–4.0)
MCH: 25.4 pg — ABNORMAL LOW (ref 26.0–34.0)
MCHC: 32.7 g/dL (ref 30.0–36.0)
MCV: 77.7 fL — AB (ref 78.0–100.0)
MONO ABS: 0.9 10*3/uL (ref 0.1–1.0)
Monocytes Relative: 11 % (ref 3–12)
Neutro Abs: 4.7 10*3/uL (ref 1.7–7.7)
Neutrophils Relative %: 59 % (ref 43–77)
Platelets: 383 10*3/uL (ref 150–400)
RBC: 4.76 MIL/uL (ref 3.87–5.11)
RDW: 16.5 % — AB (ref 11.5–15.5)
WBC: 7.9 10*3/uL (ref 4.0–10.5)

## 2013-09-01 LAB — POCT URINALYSIS DIPSTICK
Bilirubin, UA: NEGATIVE
Glucose, UA: NEGATIVE
Ketones, UA: NEGATIVE
Leukocytes, UA: NEGATIVE
NITRITE UA: NEGATIVE
Protein, UA: NEGATIVE
SPEC GRAV UA: 1.015
UROBILINOGEN UA: 0.2
pH, UA: 6

## 2013-09-01 LAB — POCT URINE PREGNANCY: Preg Test, Ur: NEGATIVE

## 2013-09-01 MED ORDER — IOHEXOL 300 MG/ML  SOLN
100.0000 mL | Freq: Once | INTRAMUSCULAR | Status: AC | PRN
  Administered 2013-09-01: 100 mL via INTRAVENOUS

## 2013-09-01 NOTE — Patient Instructions (Signed)
I will give you a call tonight with your CT results.  Assuming they are negative, please purchase OTC miralax.  You can take 2-3 doses per day until effect.    [facility]; go to admitting and they will help you get to radiology.

## 2013-09-01 NOTE — Progress Notes (Addendum)
[facility] [address] [place] [address] [phone]   Date:  09/01/2013   Name:  Leslie Huber   DOB:  1975-07-10   MRN:  [id number]  PCP:  Reginia Forts, MD    Chief Complaint: Constipation   History of Present Illness:  Leslie Huber is a 38 y.o. very pleasant female patient who presents with the following:  Here today with complaint of 3 weeks of abdominal discomfort. She was seen here 5 days ago with likely UTI.  She was treated with cipro, but I do not see a urine culture.  She feels that her urinary sx are now better. She has been constipated now for about a week.  She had a hard to pass stool a week ago- since then nothing.   She has some constipation issues that tend to occur around her menses, but she usually does not go this long without a BM.  She has tried some ex-lax OTC, and increased her water intake, fruit/ watermelon; so far has not helped She does have some abdominal pain, but his has gone on for about 3 weeks now.  She figured this would go away her her menses which occurred last week. She has had some nausea but no vomiting.  She has been able to eat some, her appetite has waxed and waned.   She has not noted any vaginal sx, no discharge or itching.   She is still passing gas.   S/p BTL and inguinal node bx- no other abdominal operations   Patient Active Problem List   Diagnosis Date Noted  . Right sided weakness 03/25/2013  . Numbness on right side 03/25/2013  . 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 Dysmenorrhea 01/22/2013  . Menorrhagia 01/22/2013  . Sarcoidosis 10/30/2012  . Fibromyalgia, secondary 10/30/2012  . Depression 10/30/2012    Past Medical History  Diagnosis Date  . Anemia   . Anxiety   . Depression   . 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 Fibromyalgia, secondary 2013    in relation to Sarcoidosis  . Ulcer 1999    duodenum  . Hypotension   . Ovarian cyst   . Migraine      Past Surgical History  Procedure Laterality Date  . Tubal ligation    . Hand tendon surgery Left     pinky, 5th digit  . Lymphnode Right 2006    pelvic area; benign    History  Substance Use Topics  . Smoking status: Never Smoker   . Smokeless tobacco: Never Used  . Alcohol Use: 0.0 - 1.0 oz/week    0-2 drink(s) per week     Comment: occasional; 1 drink a week    Family History  Problem Relation Age of Onset  . Thyroid disease Mother   . Sarcoidosis Mother   . Cancer Mother     SKIN  . Hypertension Father     Allergies  Allergen Reactions  . Sulfa Antibiotics Anaphylaxis    Medication list has been reviewed and updated.  Current Outpatient Prescriptions on File Prior to Visit  Medication Sig Dispense Refill  . clonazePAM (KLONOPIN) 0.5 MG disintegrating tablet Take 1 tablet (0.5 mg total) by mouth 2 (two) times daily as needed.  30 tablet  1  . cyclobenzaprine (FLEXERIL) 10 MG tablet Take a 1/2 or 1 at bedtime as needed for muscle pain  30 tablet  3  . DULoxetine (CYMBALTA) 60 MG capsule  Take 2 capsules (120 mg total) by mouth daily.  180 capsule  3  . IRON PO Take 45 mg by mouth daily.      . Multiple Vitamins-Minerals (CORAL CALCIUM PLUS PO) Take by mouth.      . naproxen sodium (ANAPROX) 220 MG tablet Take 220 mg by mouth 2 (two) times daily with a meal.      . phenazopyridine (PYRIDIUM) 200 MG tablet Take 1 tablet (200 mg total) by mouth 3 (three) times daily as needed.  6 tablet  0  . ciprofloxacin (CIPRO) 250 MG tablet Take 1 tablet (250 mg total) by mouth 2 (two) times daily.  6 tablet  0   No current facility-administered medications on file prior to visit.    Review of Systems:  As per HPI- otherwise negative.   Physical Examination: Filed Vitals:   09/01/13 1539  BP: 116/72  Pulse: 89  Temp: 97.2 F (36.2 C)  Resp: 16   Filed Vitals:   09/01/13 1539  Height: 5' 8.75" (1.746 m)  Weight: 171 lb 6.4 oz (77.747 kg)   Body mass index is  25.5 kg/(m^2). Ideal Body Weight: Weight in (lb) to have BMI = 25: 167.7  GEN: WDWN, NAD, Non-toxic, A & O x 3, looks well HEENT: Atraumatic, Normocephalic. Neck supple. No masses, No LAD. Ears and Nose: No external deformity. CV: RRR, No M/G/R. No JVD. No thrill. No extra heart sounds. PULM: CTA B, no wheezes, crackles, rhonchi. No retractions. No resp. distress. No accessory muscle use. ABD: S,  ND, +BS.  No HSM.   She has mild but persistent tenderness in the RLQ, and mild rebound.  EXTR: No c/c/e NEURO Normal gait.  PSYCH: Normally interactive. Conversant. Not depressed or anxious appearing.  Calm demeanor.   Results for orders placed in visit on 09/01/13  POCT UA - MICROSCOPIC ONLY      Result Value Ref Range   WBC, Ur, HPF, POC negative     RBC, urine, microscopic negative     Bacteria, U Microscopic negative     Mucus, UA negative     Epithelial cells, urine per micros negative     Crystals, Ur, HPF, POC negative     Casts, Ur, LPF, POC negative     Yeast, UA negative    POCT URINALYSIS DIPSTICK      Result Value Ref Range   Color, UA yellow     Clarity, UA clear     Glucose, UA negative     Bilirubin, UA negative     Ketones, UA negative     Spec Grav, UA 1.015     Blood, UA trace-intact     pH, UA 6.0     Protein, UA negative     Urobilinogen, UA 0.2     Nitrite, UA negative     Leukocytes, UA Negative    POCT URINE PREGNANCY      Result Value Ref Range   Preg Test, Ur Negative      Assessment and Plan: RLQ abdominal pain - Plan: POCT UA - Microscopic Only, POCT urinalysis dipstick, Urine culture, CBC with Differential, Comprehensive metabolic panel, POCT urine pregnancy, CT Abdomen Pelvis W Contrast  Unspecified constipation  Leslie Huber is here today with abdominal discomfort for about 3 weeks, and now constipation for one week.  She also has RLQ tenderness and mild rebound pain.  Appendicitis is less likely given her duration of sx, but is a consideration.   discussed CT  vs plain films with close follow-up.  After discussion of risks and benefits, she would like to go ahead and a get a CT today.    Sent for CT:  CT ABDOMEN AND PELVIS WITH CONTRAST  TECHNIQUE: Multidetector CT imaging of the abdomen and pelvis was performed using the standard protocol following bolus administration of intravenous contrast.  CONTRAST: 12m OMNIPAQUE IOHEXOL 300 MG/ML SOLN  COMPARISON: CT 10/30/2012  FINDINGS: Lung bases are clear. No effusions. Heart is normal size.  Liver, gallbladder, spleen, pancreas, adrenals and kidneys are normal.  Moderate to large stool burden throughout the colon. Appendix is not definitively seen. No inflammatory process in the right lower quadrant. Uterus, adnexae and urinary bladder are unremarkable. No free fluid, free air or adenopathy. Aorta is normal caliber.  No acute bony abnormality or focal bone lesion.  IMPRESSION: Moderate stool burden throughout the colon.  Nonvisualization of the appendix. No inflammatory process in the right lower quadrant.  No acute findings in the abdomen or pelvis.  Called and discussed with her.  No evidence of appendicitis; her appendix is not visualized, but she is low probability.  It does appear that her issue is constipation.  She will use miralax as per pt instructions, and I will touch base with her pending the rest of her labs, likely tomorrow.    Signed JLamar Blinks MD  addnd 7/21;  Received the rest of her labs.  Called to discuss with her.  No answer, VM not set up.  Will send a mychart message.  Labs look ok Results for orders placed in visit on 09/01/13  CBC WITH DIFFERENTIAL      Result Value Ref Range   WBC 7.9  4.0 - 10.5 K/uL   RBC 4.76  3.87 - 5.11 MIL/uL   Hemoglobin 12.1  12.0 - 15.0 g/dL   HCT 37.0  36.0 - 46.0 %   MCV 77.7 (*) 78.0 - 100.0 fL   MCH 25.4 (*) 26.0 - 34.0 pg   MCHC 32.7  30.0 - 36.0 g/dL   RDW 16.5 (*) 11.5 - 15.5 %   Platelets 383   150 - 400 K/uL   Neutrophils Relative % 59  43 - 77 %   Neutro Abs 4.7  1.7 - 7.7 K/uL   Lymphocytes Relative 28  12 - 46 %   Lymphs Abs 2.2  0.7 - 4.0 K/uL   Monocytes Relative 11  3 - 12 %   Monocytes Absolute 0.9  0.1 - 1.0 K/uL   Eosinophils Relative 2  0 - 5 %   Eosinophils Absolute 0.2  0.0 - 0.7 K/uL   Basophils Relative 0  0 - 1 %   Basophils Absolute 0.0  0.0 - 0.1 K/uL   Smear Review Criteria for review not met    COMPREHENSIVE METABOLIC PANEL      Result Value Ref Range   Sodium 139  135 - 145 mEq/L   Potassium 4.2  3.5 - 5.3 mEq/L   Chloride 103  96 - 112 mEq/L   CO2 28  19 - 32 mEq/L   Glucose, Bld 90  70 - 99 mg/dL   BUN 13  6 - 23 mg/dL   Creat 0.82  0.50 - 1.10 mg/dL   Total Bilirubin 0.3  0.2 - 1.2 mg/dL   Alkaline Phosphatase 65  39 - 117 U/L   AST 17  0 - 37 U/L   ALT 8  0 - 35 U/L   Total  Protein 7.5  6.0 - 8.3 g/dL   Albumin 4.2  3.5 - 5.2 g/dL   Calcium 9.4  8.4 - 10.5 mg/dL  POCT UA - MICROSCOPIC ONLY      Result Value Ref Range   WBC, Ur, HPF, POC negative     RBC, urine, microscopic negative     Bacteria, U Microscopic negative     Mucus, UA negative     Epithelial cells, urine per micros negative     Crystals, Ur, HPF, POC negative     Casts, Ur, LPF, POC negative     Yeast, UA negative    POCT URINALYSIS DIPSTICK      Result Value Ref Range   Color, UA yellow     Clarity, UA clear     Glucose, UA negative     Bilirubin, UA negative     Ketones, UA negative     Spec Grav, UA 1.015     Blood, UA trace-intact     pH, UA 6.0     Protein, UA negative     Urobilinogen, UA 0.2     Nitrite, UA negative     Leukocytes, UA Negative    POCT URINE PREGNANCY      Result Value Ref Range   Preg Test, Ur Negative

## 2013-09-02 ENCOUNTER — Telehealth: Payer: Self-pay

## 2013-09-02 LAB — COMPREHENSIVE METABOLIC PANEL
ALBUMIN: 4.2 g/dL (ref 3.5–5.2)
ALK PHOS: 65 U/L (ref 39–117)
ALT: 8 U/L (ref 0–35)
AST: 17 U/L (ref 0–37)
BILIRUBIN TOTAL: 0.3 mg/dL (ref 0.2–1.2)
BUN: 13 mg/dL (ref 6–23)
CO2: 28 mEq/L (ref 19–32)
CREATININE: 0.82 mg/dL (ref 0.50–1.10)
Calcium: 9.4 mg/dL (ref 8.4–10.5)
Chloride: 103 mEq/L (ref 96–112)
Glucose, Bld: 90 mg/dL (ref 70–99)
POTASSIUM: 4.2 meq/L (ref 3.5–5.3)
Sodium: 139 mEq/L (ref 135–145)
Total Protein: 7.5 g/dL (ref 6.0–8.3)

## 2013-09-02 LAB — URINE CULTURE
Colony Count: NO GROWTH
Organism ID, Bacteria: NO GROWTH

## 2013-09-02 NOTE — Telephone Encounter (Signed)
Patient would like to let Dr. Patsy Lageropland know that she has had a very small BM. Pain in side and back is still present. Patient has done 5 doses of laxative that Dr. Patsy Lageropland recommended. Patient states she missed phone call from Dr. Patsy Lageropland earlier. Please return call and advise. CB # [phone]

## 2013-09-02 NOTE — Telephone Encounter (Signed)
Called her back and discussed- she did pass some more stool since her last message.  Reassured her that she will likely have more stool tomorrow and will be on the way to relief.

## 2013-09-08 ENCOUNTER — Encounter: Payer: Self-pay | Admitting: Family Medicine

## 2013-09-08 DIAGNOSIS — R195 Other fecal abnormalities: Secondary | ICD-10-CM

## 2013-09-09 NOTE — Telephone Encounter (Signed)
How about I go ahead and refer you to GI at this point to get their opinion.  You may want to consider having a colonoscopy.  I will set up a referral for you.  If you have any fever or other more severe symptoms in the meantime please contact me or come back in.   [facility]

## 2013-09-10 ENCOUNTER — Telehealth: Payer: Self-pay

## 2013-09-10 NOTE — Telephone Encounter (Signed)
PT WANTED DR COPLAND TO KNOW SHE IS STILL HAVING STOMACH PROBLEMS AND TROUBLE WITH BOWEL MOVEMENTS. DIDN'T KNOW THE NEXT STEP PLEASE CALL [phone]

## 2013-09-11 NOTE — Telephone Encounter (Signed)
Advised Leslie Huber Dr. Patsy Lageropland has set up a referral for GI.  Leslie Huber understands to RTC if symptoms worsen.

## 2013-11-05 ENCOUNTER — Ambulatory Visit (INDEPENDENT_AMBULATORY_CARE_PROVIDER_SITE_OTHER): Payer: BC Managed Care – PPO

## 2013-11-05 ENCOUNTER — Ambulatory Visit (INDEPENDENT_AMBULATORY_CARE_PROVIDER_SITE_OTHER): Payer: BC Managed Care – PPO | Admitting: Family Medicine

## 2013-11-05 VITALS — BP 118/76 | HR 78 | Temp 98.1°F | Resp 18 | Ht 68.5 in | Wt 174.4 lb

## 2013-11-05 DIAGNOSIS — M25529 Pain in unspecified elbow: Secondary | ICD-10-CM

## 2013-11-05 DIAGNOSIS — L570 Actinic keratosis: Secondary | ICD-10-CM

## 2013-11-05 DIAGNOSIS — M25521 Pain in right elbow: Secondary | ICD-10-CM

## 2013-11-05 DIAGNOSIS — L989 Disorder of the skin and subcutaneous tissue, unspecified: Secondary | ICD-10-CM

## 2013-11-05 NOTE — Progress Notes (Signed)
Procedure note- VCO, area cleaned with betadine and alcohol. Lidocaine 1% 1cc used to achieve local anesthesia. Shave biopsy performed and sample collected for biopsy. Area cleaned and bandage applied. Minimal bleeding.

## 2013-11-05 NOTE — Progress Notes (Addendum)
[facility] [address], [place] [address] [phone]  Date:  11/05/2013   Name:  Leslie Huber   DOB:  Dec 15, 1975   MRN:  [id number]  PCP:  Nilda Simmer, MD    Chief Complaint: Elbow Injury and Mass   History of Present Illness:  Leslie Huber is a 38 y.o. very pleasant female patient who presents with the following:  She was chopping trees to clear a trail with a machete about 5 days ago- she felt pain in her right elbow but kept working- then had more pain later.  She is still sore and just wanted to make sure her elbow is ok.    She also has a little lesion on her left shoulder for about one month.  Can be itchy and flaky. When it started she thought it was a bug bite but it has been persistent.  It is not painful but she is a little worried that it has not gone away.    Patient Active Problem List   Diagnosis Date Noted  . Right sided weakness 03/25/2013  . Numbness on right side 03/25/2013  . 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 Dysmenorrhea 01/22/2013  . Menorrhagia 01/22/2013  . Sarcoidosis 10/30/2012  . Fibromyalgia, secondary 10/30/2012  . Depression 10/30/2012    Past Medical History  Diagnosis Date  . Anemia   . Anxiety   . Depression   . 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 Fibromyalgia, secondary 2013    in relation to Sarcoidosis  . Ulcer 1999    duodenum  . Hypotension   . Ovarian cyst   . Migraine     Past Surgical History  Procedure Laterality Date  . Tubal ligation    . Hand tendon surgery Left     pinky, 5th digit  . Lymphnode Right 2006    pelvic area; benign    History  Substance Use Topics  . Smoking status: Never Smoker   . Smokeless tobacco: Never Used  . Alcohol Use: 0.0 - 1.0 oz/week    0-2 drink(s) per week     Comment: occasional; 1 drink a week    Family History  Problem Relation Age of Onset  . Thyroid disease Mother   . Sarcoidosis Mother   . Cancer Mother     SKIN  . Hypertension Father     Allergies  Allergen Reactions  . Sulfa Antibiotics Anaphylaxis    Medication list has been reviewed and updated.  Current Outpatient Prescriptions on File Prior to Visit  Medication Sig Dispense Refill  . clonazePAM (KLONOPIN) 0.5 MG disintegrating tablet Take 1 tablet (0.5 mg total) by mouth 2 (two) times daily as needed.  30 tablet  1  . cyclobenzaprine (FLEXERIL) 10 MG tablet Take a 1/2 or 1 at bedtime as needed for muscle pain  30 tablet  3  . DULoxetine (CYMBALTA) 60 MG capsule Take 2 capsules (120 mg total) by mouth daily.  180 capsule  3  . IRON PO Take 45 mg by mouth daily.      . Multiple Vitamins-Minerals (CORAL CALCIUM PLUS PO) Take by mouth.      . naproxen sodium (ANAPROX) 220 MG tablet Take 220 mg by mouth 2 (two) times daily with a meal.      . phenazopyridine (PYRIDIUM) 200 MG tablet Take 1 tablet (200 mg total) by mouth 3 (three) times daily as needed.  6 tablet  0  No current facility-administered medications on file prior to visit.    Review of Systems:  As per HPI- otherwise negative.   Physical Examination: Filed Vitals:   11/05/13 1117  BP: 118/76  Pulse: 78  Temp: 98.1 F (36.7 C)  Resp: 18   Filed Vitals:   11/05/13 1117  Height: 5' 8.5" (1.74 m)  Weight: 174 lb 6.4 oz (79.107 kg)   Body mass index is 26.13 kg/(m^2). Ideal Body Weight: Weight in (lb) to have BMI = 25: 166.5  GEN: WDWN, NAD, Non-toxic, A & O x 3 HEENT: Atraumatic, Normocephalic. Neck supple. No masses, No LAD. Ears and Nose: No external deformity. CV: RRR, No M/G/R. No JVD. No thrill. No extra heart sounds. PULM: CTA B, no wheezes, crackles, rhonchi. No retractions. No resp. distress. No accessory muscle use. ABD: S, NT, ND, +BS. No rebound. No HSM. EXTR: No c/c/e NEURO Normal gait.  PSYCH: Normally interactive. Conversant. Not depressed or anxious appearing.  Calm demeanor.  Right elbow: she has normal ROM to flexion/ extension/  supination and pronation.  Tender over the lateral epicondyle.  No swelling or redness, no heat. No skin lesion or laceration Tenderness with resisted supination Small scaly lesion on the anterior left shoulder, amenable to shave  [facility] reading (PRIMARY) by  Dr. . Patsy Lager. Right elbow: negative for any fracture or other abnormality  Shave bx of skin lesion performed by Deboraha Sprang, NP.   Assessment and Plan: Right elbow pain - Plan: DG Elbow Complete Right  Skin lesion of left arm - Plan: Dermatology pathology  Placed in a sling for her right arm- suspect she has lateral epicondylitis.  She will wear this as needed for the next 4-5 days and also be sure to do ROM for her elbow and shoulder.  If not getting better refer to ortho for possible injection- she will let me know.   Await pathology report   Signed Abbe Amsterdam, MD  Called 10/3 to go over her path report.  She has an actinic keratosis.  I explained that this is not cancer, but can sometimes turn into a SCC.  Will refer to derm for further evaluation

## 2013-11-15 DIAGNOSIS — L570 Actinic keratosis: Secondary | ICD-10-CM | POA: Insufficient documentation

## 2013-11-15 NOTE — Addendum Note (Signed)
Addended by: Abbe AmsterdamOPLAND, Joyceline Maiorino C on: 11/15/2013 03:58 PM   Modules accepted: Orders

## 2013-12-01 ENCOUNTER — Ambulatory Visit (INDEPENDENT_AMBULATORY_CARE_PROVIDER_SITE_OTHER): Payer: BC Managed Care – PPO | Admitting: Family Medicine

## 2013-12-01 ENCOUNTER — Encounter: Payer: Self-pay | Admitting: Family Medicine

## 2013-12-01 VITALS — BP 120/68 | HR 84 | Temp 97.9°F | Resp 16 | Ht 68.5 in | Wt 179.0 lb

## 2013-12-01 DIAGNOSIS — R635 Abnormal weight gain: Secondary | ICD-10-CM

## 2013-12-01 DIAGNOSIS — G4700 Insomnia, unspecified: Secondary | ICD-10-CM

## 2013-12-01 DIAGNOSIS — F32A Depression, unspecified: Secondary | ICD-10-CM

## 2013-12-01 DIAGNOSIS — Z23 Encounter for immunization: Secondary | ICD-10-CM

## 2013-12-01 DIAGNOSIS — F329 Major depressive disorder, single episode, unspecified: Secondary | ICD-10-CM

## 2013-12-01 LAB — POCT URINE PREGNANCY: PREG TEST UR: NEGATIVE

## 2013-12-01 LAB — TSH: TSH: 1.899 u[IU]/mL (ref 0.350–4.500)

## 2013-12-01 MED ORDER — CLONAZEPAM 0.5 MG PO TBDP: 0.5000 mg | ORAL_TABLET | Freq: Two times a day (BID) | ORAL | Status: DC | PRN

## 2013-12-01 NOTE — Progress Notes (Signed)
[facility] [address], [place] [id number] [phone]  Date:  12/01/2013   Name:  Leslie Huber   DOB:  [birth year]   MRN:  [id number]  PCP:  Nilda SimmerSMITH,KRISTI, MD    Chief Complaint: Anxiety, Depression and Headache   History of Present Illness:  Leslie Huber is a 38 y.o. very pleasant female patient who presents with the following:  Here today to discuss mood problems.   Seen about 3 weeks ago with a right elbow injury that occurred while using a machete to clear a trail.  We also removed what turned out to be an AK from her shoulder.  Referred to derm for further eval.  Her elbow is now better.   She is here today because she has noted weight gain.  She has been watching her diet and exercise but is still gaining. Also, her cymbalta does not seem to be working as well as it had in the past.  She has been on cymbalta for about 4 years.  This was started both for her mood and also [facility] due to her sarcoid.    Over the last couple of months she is more irritatable.  "my children have noted that I am more irritable." She does not seem to be more sad, but she does cry more easily.    She has not noted any changes in her hair or skin.   She has used zoloft and wellbutrin in tandem in the past.  However over time this did not help her as much as it had, and she changed to cymbalta due to pain.    She is taking more klonopin to sleep at night.  Appetite is not as good.  Energy level seems to be low.   She is s/p BTL, had a period last week.    Denies any SI  Wt Readings from Last 3 Encounters:  12/01/13 179 lb (81.194 kg)  11/05/13 174 lb 6.4 oz (79.107 kg)  09/01/13 171 lb 6.4 oz (77.747 kg)     Patient Active Problem List   Diagnosis Date Noted  . Actinic keratosis 11/15/2013  . Right sided weakness 03/25/2013  . Numbness on right side 03/25/2013  . 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 Dysmenorrhea 01/22/2013  . Menorrhagia 01/22/2013   . Sarcoidosis 10/30/2012  . Fibromyalgia, secondary 10/30/2012  . Depression 10/30/2012    Past Medical History  Diagnosis Date  . Anemia   . Anxiety   . Depression   . 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 Fibromyalgia, secondary 2013    in relation to Sarcoidosis  . Ulcer 1999    duodenum  . Hypotension   . Ovarian cyst   . Migraine     Past Surgical History  Procedure Laterality Date  . Tubal ligation    . Hand tendon surgery Left     pinky, 5th digit  . Lymphnode Right 2006    pelvic area; benign    History  Substance Use Topics  . Smoking status: Never Smoker   . Smokeless tobacco: Never Used  . Alcohol Use: 0.0 - 1.0 oz/week    0-2 drink(s) per week     Comment: occasional; 1 drink a week    Family History  Problem Relation Age of Onset  . Thyroid disease Mother   . Sarcoidosis Mother   . Cancer Mother     SKIN  . Hypertension Father  Allergies  Allergen Reactions  . Sulfa Antibiotics Anaphylaxis    Medication list has been reviewed and updated.  Current Outpatient Prescriptions on File Prior to Visit  Medication Sig Dispense Refill  . clonazePAM (KLONOPIN) 0.5 MG disintegrating tablet Take 1 tablet (0.5 mg total) by mouth 2 (two) times daily as needed.  30 tablet  1  . cyclobenzaprine (FLEXERIL) 10 MG tablet Take a 1/2 or 1 at bedtime as needed for muscle pain  30 tablet  3  . DULoxetine (CYMBALTA) 60 MG capsule Take 2 capsules (120 mg total) by mouth daily.  180 capsule  3  . IRON PO Take 45 mg by mouth daily.      . Multiple Vitamins-Minerals (CORAL CALCIUM PLUS PO) Take by mouth.      . naproxen sodium (ANAPROX) 220 MG tablet Take 220 mg by mouth 2 (two) times daily with a meal.      . phenazopyridine (PYRIDIUM) 200 MG tablet Take 1 tablet (200 mg total) by mouth 3 (three) times daily as needed.  6 tablet  0   No current facility-administered medications on file prior to visit.    Review of Systems:  As per HPI-  otherwise negative.   Physical Examination: Filed Vitals:   12/01/13 0844  BP: 120/68  Pulse: 84  Temp: 97.9 F (36.6 C)  Resp: 16   Filed Vitals:   12/01/13 0844  Height: 5' 8.5" (1.74 m)  Weight: 179 lb (81.194 kg)   Body mass index is 26.82 kg/(m^2). Ideal Body Weight: Weight in (lb) to have BMI = 25: 166.5  GEN: WDWN, NAD, Non-toxic, A & O x 3, looks well, mild overweight HEENT: Atraumatic, Normocephalic. Neck supple. No masses, No LAD. Ears and Nose: No external deformity. CV: RRR, No M/G/R. No JVD. No thrill. No extra heart sounds. PULM: CTA B, no wheezes, crackles, rhonchi. No retractions. No resp. distress. No accessory muscle use. ABD: S, NT, ND EXTR: No c/c/e NEURO Normal gait.  PSYCH: Normally interactive. Conversant. Not depressed or anxious appearing.  Calm demeanor.   Results for orders placed in visit on 12/01/13  POCT URINE PREGNANCY      Result Value Ref Range   Preg Test, Ur Negative      Assessment and Plan: Unexplained weight gain - Plan: TSH, POCT urine pregnancy  Depression  Immunization due - Plan: Flu Vaccine QUAD 36+ mos IM  Insomnia - Plan: clonazePAM (KLONOPIN) 0.5 MG disintegrating tablet  Weight gain and worsening of depression sx. Will make sure her thyroid is not to blame.  Assuming this is normal will change her back over to zoloft after tapering off cymbalta.   Will plan further follow- up pending labs.   Signed Abbe AmsterdamJessica Ricketta Colantonio, MD

## 2013-12-01 NOTE — Patient Instructions (Signed)
Let's wait and see what your thyroid level shows us.  I will be in touch with your TSH on mychart, and then we can make our plan.

## 2013-12-02 ENCOUNTER — Encounter: Payer: Self-pay | Admitting: Family Medicine

## 2013-12-02 MED ORDER — SERTRALINE HCL 50 MG PO TABS: 50.0000 mg | ORAL_TABLET | Freq: Every day | ORAL | Status: DC

## 2013-12-02 NOTE — Addendum Note (Signed)
Addended by: Abbe AmsterdamOPLAND, Jinny Sweetland C on: 12/02/2013 05:56 PM   Modules accepted: Orders, Medications

## 2013-12-08 ENCOUNTER — Telehealth: Payer: Self-pay

## 2013-12-08 NOTE — Telephone Encounter (Signed)
Pt states she is having major effects from withdrawal of Cymbalta,can barely function,please advise   Best phone for pt is [phone]   Pharmacy [facility] [address] [place]

## 2013-12-08 NOTE — Telephone Encounter (Signed)
Called patient, no answer. Unable to leave message because voice mail has not been set up yet. Will try again later.

## 2013-12-08 NOTE — Telephone Encounter (Signed)
Pt was advised to wean off of cymbalta and start zoloft.   Pt has been taking 1/2 tab  of cymbalta qd and has been experiencing HA's, nausea, and trouble concentrating. She is afraid to drive. Wants to know if there is a slower way to wean off of cymbalta? Please advise. Thanks

## 2013-12-08 NOTE — Telephone Encounter (Signed)
Called her back to discuss.  She may need to do a slower taper of cymbalta. Discussed how to do this, we may also rx some 30mg  capsules if she needs them.  Otherwise she will try 120/60 alternating, then 60 daily, then 60 every other day, every 3rd day, etc. She will spend 7-10 days on each step

## 2013-12-10 ENCOUNTER — Telehealth: Payer: Self-pay

## 2013-12-10 DIAGNOSIS — F330 Major depressive disorder, recurrent, mild: Secondary | ICD-10-CM

## 2013-12-10 MED ORDER — DULOXETINE HCL 30 MG PO CPEP: 30.0000 mg | ORAL_CAPSULE | Freq: Every day | ORAL | Status: DC

## 2013-12-10 NOTE — Telephone Encounter (Signed)
Pt wanted Dr.Copland to know she would like to have the Cymbalta $RemoveBefor [id number]$30mgPlease call [phone]

## 2013-12-15 ENCOUNTER — Encounter: Payer: Self-pay | Admitting: Family Medicine

## 2013-12-17 ENCOUNTER — Other Ambulatory Visit: Payer: Self-pay | Admitting: Family Medicine

## 2013-12-17 NOTE — Telephone Encounter (Signed)
Dr Patsy Lageropland, you saw pt in Oct but don't see this med discussed. Do you want to RF?

## 2013-12-18 ENCOUNTER — Encounter: Payer: BC Managed Care – PPO | Admitting: Gynecology

## 2013-12-30 ENCOUNTER — Encounter: Payer: BC Managed Care – PPO | Admitting: Gynecology

## 2014-01-27 ENCOUNTER — Encounter: Payer: Self-pay | Admitting: Family Medicine

## 2014-01-27 DIAGNOSIS — J209 Acute bronchitis, unspecified: Secondary | ICD-10-CM

## 2014-01-28 ENCOUNTER — Ambulatory Visit (INDEPENDENT_AMBULATORY_CARE_PROVIDER_SITE_OTHER): Payer: BC Managed Care – PPO | Admitting: Family Medicine

## 2014-01-28 VITALS — BP 120/80 | HR 66 | Temp 98.0°F | Resp 16 | Ht 69.0 in | Wt 176.0 lb

## 2014-01-28 DIAGNOSIS — R002 Palpitations: Secondary | ICD-10-CM

## 2014-01-28 LAB — TROPONIN I

## 2014-01-28 NOTE — Progress Notes (Signed)
[facility] [address], [place] [id number] [phone]  Date:  01/28/2014   Name:  Leslie ShadowJennifer E Vanhoesen   DOB:  May 31, 1975   MRN:  [id number]  PCP:  Nilda SimmerSMITH,KRISTI, MD    Chief Complaint: Medication Management; Palpitations; and Medication Refill   History of Present Illness:  Leslie Huber is a 38 y.o. very pleasant female patient who presents with the following:  Here today for a recheck. She is slowly weaning off cymbalta.  If she has excessive headache she will bump her dose back up temporarily which helps.  Over the last couple of weeks she has noted some palpitations.  She will feel "tight in my chest, my heart beats fast" for 30 seconds or so- and then it goes back to normal.  This does not seem like an anxiety attack to her- she does not feel flushed.   Not associated with exercise- can occur anytime. Sometimes when this occurs she will feel a "cramping" feeling in her chest.  Today she feels ok- yesterday she had a bad day with lots of episodes No CP now.    Normal TSH a couple of months ago.    Patient Active Problem List   Diagnosis Date Noted  . Actinic keratosis 11/15/2013  . Right sided weakness 03/25/2013  . Numbness on right side 03/25/2013  . 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 Dysmenorrhea 01/22/2013  . Menorrhagia 01/22/2013  . Sarcoidosis 10/30/2012  . Fibromyalgia, secondary 10/30/2012  . Depression 10/30/2012    Past Medical History  Diagnosis Date  . Anemia   . Anxiety   . Depression   . 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 Fibromyalgia, secondary 2013    in relation to Sarcoidosis  . Ulcer 1999    duodenum  . Hypotension   . Ovarian cyst   . Migraine     Past Surgical History  Procedure Laterality Date  . Tubal ligation    . Hand tendon surgery Left     pinky, 5th digit  . Lymphnode Right 2006    pelvic area; benign    History  Substance Use Topics  . Smoking status: Never Smoker    . Smokeless tobacco: Never Used  . Alcohol Use: 0.0 - 1.2 oz/week    0-2 Not specified per week     Comment: occasional; 1 drink a week    Family History  Problem Relation Age of Onset  . Thyroid disease Mother   . Sarcoidosis Mother   . Cancer Mother     SKIN  . Hypertension Father     Allergies  Allergen Reactions  . Sulfa Antibiotics Anaphylaxis    Medication list has been reviewed and updated.  Current Outpatient Prescriptions on File Prior to Visit  Medication Sig Dispense Refill  . clonazePAM (KLONOPIN) 0.5 MG disintegrating tablet Take 1 tablet (0.5 mg total) by mouth 2 (two) times daily as needed. 30 tablet 1  . cyclobenzaprine (FLEXERIL) 10 MG tablet TAKE 1/2 TO 1 TABLET BY MOUTH AT BEDTIME AS NEEDED FOR MUSCLE PAIN 30 tablet 4  . DULoxetine (CYMBALTA) 30 MG capsule Take 1 capsule (30 mg total) by mouth daily. 30 capsule 3  . IRON PO Take 45 mg by mouth daily.    . Multiple Vitamins-Minerals (CORAL CALCIUM PLUS PO) Take by mouth.    . naproxen sodium (ANAPROX) 220 MG tablet Take 220 mg by mouth 2 (two) times daily with a meal.    .  sertraline (ZOLOFT) 50 MG tablet Take 1 tablet (50 mg total) by mouth daily. (Patient not taking: Reported on 01/28/2014) 30 tablet 3   No current facility-administered medications on file prior to visit.    Review of Systems:  As per HPI- otherwise negative.   Physical Examination: Filed Vitals:   01/28/14 1154  BP: 112/7  Pulse: 66  Temp: 98 F (36.7 C)  Resp: 16   Filed Vitals:   01/28/14 1154  Height: 5\' 9"  (1.753 m)  Weight: 176 lb (79.833 kg)   Body mass index is 25.98 kg/(m^2). Ideal Body Weight: Weight in (lb) to have BMI = 25: 168.9  GEN: WDWN, NAD, Non-toxic, A & O x 3, looks well HEENT: Atraumatic, Normocephalic. Neck supple. No masses, No LAD. Ears and Nose: No external deformity. CV: RRR, No M/G/R. No JVD. No thrill. No extra heart sounds. PULM: CTA B, no wheezes, crackles, rhonchi. No retractions. No  resp. distress. No accessory muscle use. EXTR: No c/c/e NEURO Normal gait.  PSYCH: Normally interactive. Conversant. Not depressed or anxious appearing.  Calm demeanor.   EKG:  NSR, no ST elevation or depression  Assessment and Plan: Palpitations - Plan: EKG 12-Lead, Comprehensive metabolic panel, Troponin I  Recent increase in palpitations.  She reports that she did wear a holter monitor a long time ago- cannot remember exactly why but her result was ok.  This may be due to weaning off her cymbalta or may be due to PVCs.  Check troponin although we expect it will be negative At this time she does not wish to go back to cardiology.  She feels reassured and will keep me up to date on her condition.  If getting worse of any other concerns she will let me know  Results for orders placed or performed in visit on 01/28/14  Troponin I  Result Value Ref Range   Troponin I <0.01 <0.06 ng/mL    Signed Abbe AmsterdamJessica Nethan Caudillo, MD

## 2014-01-28 NOTE — Patient Instructions (Signed)
Good to see you today!  Keep me posted and I will send your results on mychart

## 2014-01-29 LAB — COMPREHENSIVE METABOLIC PANEL
ALT: 12 U/L (ref 0–35)
AST: 20 U/L (ref 0–37)
Albumin: 4.5 g/dL (ref 3.5–5.2)
Alkaline Phosphatase: 79 U/L (ref 39–117)
BILIRUBIN TOTAL: 0.4 mg/dL (ref 0.2–1.2)
BUN: 11 mg/dL (ref 6–23)
CALCIUM: 10 mg/dL (ref 8.4–10.5)
CO2: 26 meq/L (ref 19–32)
Chloride: 103 mEq/L (ref 96–112)
Creat: 0.77 mg/dL (ref 0.50–1.10)
Glucose, Bld: 87 mg/dL (ref 70–99)
POTASSIUM: 4.8 meq/L (ref 3.5–5.3)
SODIUM: 140 meq/L (ref 135–145)
Total Protein: 7.8 g/dL (ref 6.0–8.3)

## 2014-02-03 ENCOUNTER — Other Ambulatory Visit: Payer: Self-pay | Admitting: Family Medicine

## 2014-02-03 MED ORDER — AZITHROMYCIN 250 MG PO TABS: ORAL_TABLET | ORAL | Status: DC

## 2014-02-03 NOTE — Telephone Encounter (Signed)
Gave her a call. She has been coughing on an off for about 3 weeks. It seemed to be getting better but then got "drastically worse" over the last 4-5 days.  She had noted some chills but no fever- she did check her temp and it was normal The cough is not productive- it is dry but "chest shaking," she has had some near emesis from cough. She is s/p btl so does not suspect risk of pregnancy  Will go ahead and rx azithromycin for persistent cough.  She will let me know if not improving soon- Sooner if worse.   Meds ordered this encounter  Medications  . azithromycin (ZITHROMAX) 250 MG tablet    Sig: Use as a zpack    Dispense:  6 tablet    Refill:  0

## 2014-02-05 ENCOUNTER — Telehealth: Payer: Self-pay | Admitting: Family Medicine

## 2014-02-05 NOTE — Telephone Encounter (Signed)
-----   Message from Pearline CablesJessica C Asja Frommer, MD sent at 02/02/2014  8:38 PM EST ----- c heck on her

## 2014-02-25 ENCOUNTER — Ambulatory Visit (INDEPENDENT_AMBULATORY_CARE_PROVIDER_SITE_OTHER): Payer: BLUE CROSS/BLUE SHIELD | Admitting: Family Medicine

## 2014-02-25 VITALS — BP 126/70 | HR 86 | Temp 98.1°F | Resp 16 | Ht 69.5 in | Wt 180.4 lb

## 2014-02-25 DIAGNOSIS — R05 Cough: Secondary | ICD-10-CM

## 2014-02-25 DIAGNOSIS — J4520 Mild intermittent asthma, uncomplicated: Secondary | ICD-10-CM

## 2014-02-25 DIAGNOSIS — R059 Cough, unspecified: Secondary | ICD-10-CM

## 2014-02-25 MED ORDER — PREDNISONE 20 MG PO TABS: ORAL_TABLET | ORAL | Status: DC

## 2014-02-25 MED ORDER — ALBUTEROL SULFATE HFA 108 (90 BASE) MCG/ACT IN AERS: 2.0000 | INHALATION_SPRAY | Freq: Four times a day (QID) | RESPIRATORY_TRACT | Status: AC | PRN

## 2014-02-25 NOTE — Patient Instructions (Signed)
Use the prednisone as directed and the albuterol as needed.  If you are not better after this please let me know- Sooner if worse.

## 2014-02-25 NOTE — Progress Notes (Signed)
[facility] [address], [place] [address] [phone]  Date:  02/25/2014   Name:  Leslie Huber   DOB:  [birth year]   MRN:  [id number]  PCP:  Nilda Simmer, MD    Chief Complaint: Cough   History of Present Illness:  Leslie Huber is a 39 y.o. very pleasant female patient who presents with the following:  Here today to follow-up persistent cough.  She had called [place] on 12/22 with cough for about 3 weeks- it had gotten worse, no fever.  I called her in a zpack.   She notes that she is still coughing some.  She does not have any nasal congestion, no sneezing.  She will occasionally cough up some mucus.   History of pulmonary sarcoidosis via labs- however she is not sure what the significance of this is.   She has never had any "sign of it" on x-rays.  Cold air and exertion makes her cough.   Negative CXR about one year ago.   Currently she is using mucinex dm.   No fever  Patient Active Problem List   Diagnosis Date Noted  . Actinic keratosis 11/15/2013  . Right sided weakness 03/25/2013  . Numbness on right side 03/25/2013  . 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 Dysmenorrhea 01/22/2013  . Menorrhagia 01/22/2013  . Sarcoidosis 10/30/2012  . Fibromyalgia, secondary 10/30/2012  . Depression 10/30/2012    Past Medical History  Diagnosis Date  . Anemia   . Anxiety   . Depression   . 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 Fibromyalgia, secondary 2013    in relation to Sarcoidosis  . Ulcer 1999    duodenum  . Hypotension   . Ovarian cyst   . Migraine     Past Surgical History  Procedure Laterality Date  . Tubal ligation    . Hand tendon surgery Left     pinky, 5th digit  . Lymphnode Right 2006    pelvic area; benign    History  Substance Use Topics  . Smoking status: Never Smoker   . Smokeless tobacco: Never Used  . Alcohol Use: 0.0 - 1.2 oz/week    0-2 Not specified per week     Comment: occasional; 1  drink a week    Family History  Problem Relation Age of Onset  . Thyroid disease Mother   . Sarcoidosis Mother   . Cancer Mother     SKIN  . Hypertension Father     Allergies  Allergen Reactions  . Sulfa Antibiotics Anaphylaxis    Medication list has been reviewed and updated.  Current Outpatient Prescriptions on File Prior to Visit  Medication Sig Dispense Refill  . clonazePAM (KLONOPIN) 0.5 MG disintegrating tablet Take 1 tablet (0.5 mg total) by mouth 2 (two) times daily as needed. 30 tablet 1  . cyclobenzaprine (FLEXERIL) 10 MG tablet TAKE 1/2 TO 1 TABLET BY MOUTH AT BEDTIME AS NEEDED FOR MUSCLE PAIN 30 tablet 4  . IRON PO Take 45 mg by mouth daily.    . Multiple Vitamins-Minerals (CORAL CALCIUM PLUS PO) Take by mouth.    . naproxen sodium (ANAPROX) 220 MG tablet Take 220 mg by mouth 2 (two) times daily with a meal.     No current facility-administered medications on file prior to visit.    Review of Systems:  As per HPI- otherwise negative.   Physical Examination: Filed Vitals:   02/25/14 1100  BP: 126/70  Pulse: 86  Temp: 98.1 F (36.7 C)  Resp: 16   Filed Vitals:   02/25/14 1100  Height: 5' 9.5" (1.765 m)  Weight: 180 lb 6.4 oz (81.829 kg)   Body mass index is 26.27 kg/(m^2). Ideal Body Weight: Weight in (lb) to have BMI = 25: 171.4  GEN: WDWN, NAD, Non-toxic, A & O x 3, looks well HEENT: Atraumatic, Normocephalic. Neck supple. No masses, No LAD.  Bilateral TM wnl, oropharynx normal.  PEERL,EOMI.   Ears and Nose: No external deformity. CV: RRR, No M/G/R. No JVD. No thrill. No extra heart sounds. PULM: CTA B, no wheezes, crackles, rhonchi. No retractions. No resp. distress. No accessory muscle use. EXTR: No c/c/e NEURO Normal gait.  PSYCH: Normally interactive. Conversant. Not depressed or anxious appearing.  Calm demeanor.    Assessment and Plan: Reactive airway disease, mild intermittent, uncomplicated - Plan: predniSONE (DELTASONE) 20 MG tablet,  albuterol (PROVENTIL HFA;VENTOLIN HFA) 108 (90 BASE) MCG/ACT inhaler  Cough - Plan: predniSONE (DELTASONE) 20 MG tablet, albuterol (PROVENTIL HFA;VENTOLIN HFA) 108 (90 BASE) MCG/ACT inhaler  Persistent cough after treatment with zpack.  Never a smoker.  Questionable history of pulmonary sarcoid with negative CXR  A year ago.  Will treat with a short course of prednisone and prn albuterol.  She will let me know if not better soon  Signed Abbe AmsterdamJessica Fabiano Ginley, MD

## 2014-03-10 ENCOUNTER — Ambulatory Visit (INDEPENDENT_AMBULATORY_CARE_PROVIDER_SITE_OTHER): Payer: BLUE CROSS/BLUE SHIELD | Admitting: Physician Assistant

## 2014-03-10 VITALS — BP 122/78 | HR 68 | Temp 98.3°F | Resp 16 | Ht 69.5 in | Wt 176.6 lb

## 2014-03-10 DIAGNOSIS — R3989 Other symptoms and signs involving the genitourinary system: Secondary | ICD-10-CM

## 2014-03-10 DIAGNOSIS — R39198 Other difficulties with micturition: Secondary | ICD-10-CM

## 2014-03-10 DIAGNOSIS — R102 Pelvic and perineal pain: Secondary | ICD-10-CM

## 2014-03-10 LAB — POCT UA - MICROSCOPIC ONLY
Bacteria, U Microscopic: NEGATIVE
CASTS, UR, LPF, POC: NEGATIVE
CRYSTALS, UR, HPF, POC: NEGATIVE
Mucus, UA: NEGATIVE
Yeast, UA: NEGATIVE

## 2014-03-10 LAB — POCT CBC
Granulocyte percent: 64 %G (ref 37–80)
HCT, POC: 36.6 % — AB (ref 37.7–47.9)
Hemoglobin: 11.4 g/dL — AB (ref 12.2–16.2)
Lymph, poc: 2.7 (ref 0.6–3.4)
MCH: 24.5 pg — AB (ref 27–31.2)
MCHC: 31.1 g/dL — AB (ref 31.8–35.4)
MCV: 78.8 fL — AB (ref 80–97)
MID (cbc): 0.4 (ref 0–0.9)
MPV: 8.9 fL (ref 0–99.8)
POC GRANULOCYTE: 5.4 (ref 2–6.9)
POC LYMPH PERCENT: 31.6 %L (ref 10–50)
POC MID %: 4.4 %M (ref 0–12)
Platelet Count, POC: 366 10*3/uL (ref 142–424)
RBC: 4.65 M/uL (ref 4.04–5.48)
RDW, POC: 17.7 %
WBC: 8.4 10*3/uL (ref 4.6–10.2)

## 2014-03-10 LAB — POCT URINALYSIS DIPSTICK
BILIRUBIN UA: NEGATIVE
Glucose, UA: NEGATIVE
Ketones, UA: NEGATIVE
LEUKOCYTES UA: NEGATIVE
Nitrite, UA: NEGATIVE
Protein, UA: NEGATIVE
SPEC GRAV UA: 1.01
Urobilinogen, UA: 0.2
pH, UA: 7

## 2014-03-10 LAB — POCT WET PREP WITH KOH
Clue Cells Wet Prep HPF POC: NEGATIVE
KOH PREP POC: NEGATIVE
TRICHOMONAS UA: NEGATIVE
Yeast Wet Prep HPF POC: NEGATIVE

## 2014-03-10 LAB — POCT URINE PREGNANCY: Preg Test, Ur: NEGATIVE

## 2014-03-10 MED ORDER — PHENAZOPYRIDINE HCL 200 MG PO TABS: 200.0000 mg | ORAL_TABLET | Freq: Three times a day (TID) | ORAL | Status: DC | PRN

## 2014-03-10 NOTE — Progress Notes (Signed)
Subjective:    Patient ID: Leslie Huber, female    DOB: Sep 21, 1975, 39 y.o.   MRN: [id number]  HPI  Pt presents to clinic with RLQ pain for the last 1.5 weeks and then 3 days h/o urinary symptoms.  She has had ovarian cysts before and this pain is similar yet different in the fact that it has lasted so long.  It is not really getting worse but it is not getting better.  She feels that the pain waxes and wains but nothing seems to make it better or worse - and when it is worse it seems to radiate a little down her right leg.  She does not feel like her back hurts from a muscular origin but it does have some pain when her RLQ pain is the worst.  Her urinary symptoms are like to past UTIs she has had.  She is currently sexually active with her husband without STD concerns and she has a BTL.  Her last menses on 1/11 was thinner and had less clots than normal.  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    appt for gyn next month - [facility] - for her yearly appt  Review of Systems  Constitutional: Negative for fever and chills.  Gastrointestinal: Positive for abdominal pain.  Genitourinary: Negative for dysuria, urgency and frequency.  Musculoskeletal: Positive for back pain (rigth side - assocaited with the RLQ pain- ache).       Objective:   Physical Exam  Constitutional: She is oriented to person, place, and time. She appears well-developed and well-nourished.  BP 122/78 mmHg  Pulse 68  Temp(Src) 98.3 F (36.8 C) (Oral)  Resp 16  Ht 5' 9.5" (1.765 m)  Wt 176 lb 9.6 oz (80.105 kg)  BMI 25.71 kg/m2  SpO2 99%  LMP 02/23/2014   HENT:  Head: Normocephalic and atraumatic.  Right Ear: External ear normal.  Left Ear: External ear normal.  Cardiovascular: Normal rate, regular rhythm and normal heart sounds.   No murmur heard. Pulmonary/Chest: Effort normal and breath sounds normal. She has no wheezes.  Abdominal: Soft. There is tenderness (mild  RLQ pain - more in the right pelvic region). There is no rebound, no guarding and no CVA tenderness.  Genitourinary: Vagina normal and uterus normal. There is no rash, tenderness, lesion or injury on the right labia. There is no rash, tenderness, lesion or injury on the left labia. Cervix exhibits no motion tenderness, no discharge and no friability. Right adnexum displays no mass, no tenderness and no fullness. Left adnexum displays no mass, no tenderness and no fullness.  Neurological: She is alert and oriented to person, place, and time.  Skin: Skin is warm and dry.  Psychiatric: She has a normal mood and affect. Her behavior is normal. Judgment and thought content normal.   Results for orders placed or performed in visit on 03/10/14  POCT urinalysis dipstick  Result Value Ref Range   Color, UA yellow    Clarity, UA clear    Glucose, UA neg    Bilirubin, UA neg    Ketones, UA neg    Spec Grav, UA 1.010    Blood, UA trace-itnact    pH, UA 7.0    Protein, UA neg    Urobilinogen, UA 0.2    Nitrite, UA neg    Leukocytes, UA Negative   POCT UA - Microscopic Only  Result Value Ref Range  WBC, Ur, HPF, POC 0-3    RBC, urine, microscopic 1-3    Bacteria, U Microscopic neg    Mucus, UA neg    Epithelial cells, urine per micros 0-2    Crystals, Ur, HPF, POC neg    Casts, Ur, LPF, POC neg    Yeast, UA neg   POCT CBC  Result Value Ref Range   WBC 8.4 4.6 - 10.2 K/uL   Lymph, poc 2.7 0.6 - 3.4   POC LYMPH PERCENT 31.6 10 - 50 %L   MID (cbc) 0.4 0 - 0.9   POC MID % 4.4 0 - 12 %M   POC Granulocyte 5.4 2 - 6.9   Granulocyte percent 64.0 37 - 80 %G   RBC 4.65 4.04 - 5.48 M/uL   Hemoglobin 11.4 (A) 12.2 - 16.2 g/dL   HCT, POC 16.1 (A) 09.6 - 47.9 %   MCV 78.8 (A) 80 - 97 fL   MCH, POC 24.5 (A) 27 - 31.2 pg   MCHC 31.1 (A) 31.8 - 35.4 g/dL   RDW, POC 04.5 %   Platelet Count, POC 366 142 - 424 K/uL   MPV 8.9 0 - 99.8 fL  POCT urine pregnancy  Result Value Ref Range   Preg Test, Ur  Negative   POCT Wet Prep with KOH  Result Value Ref Range   Trichomonas, UA Negative    Clue Cells Wet Prep HPF POC neg    Epithelial Wet Prep HPF POC 0-2    Yeast Wet Prep HPF POC neg    Bacteria Wet Prep HPF POC 1+    RBC Wet Prep HPF POC 0-1    WBC Wet Prep HPF POC 0-1    KOH Prep POC Negative        Assessment & Plan:  Difficulty urinating - Plan: POCT urinalysis dipstick, POCT UA - Microscopic Only, Urine culture, phenazopyridine (PYRIDIUM) 200 MG tablet  Pelvic pain in female - Plan: POCT CBC, POCT urine pregnancy, POCT Wet Prep with KOH  With a normal urine, wet prep and CBC an non-ruptured ovarian cyst is most likely esp with a relatively benign exam.  We were able to get an appt for the patient tomorrow with Dr Vickey Sages so she can have a pelvic US at her gyn's office  Benny Lennert PA-C  [facility] 03/12/2014 3:18 PM     Dr Vickey Sages tomorrow 11:00am

## 2014-03-10 NOTE — Patient Instructions (Signed)
Dr Vickey SagesAtkins - tomorrow (1/27 at 11:00am)

## 2014-03-12 LAB — URINE CULTURE
Colony Count: NO GROWTH
ORGANISM ID, BACTERIA: NO GROWTH

## 2014-05-18 ENCOUNTER — Other Ambulatory Visit: Payer: Self-pay | Admitting: Obstetrics and Gynecology

## 2014-05-19 LAB — CYTOLOGY - PAP

## 2014-07-05 ENCOUNTER — Other Ambulatory Visit: Payer: Self-pay | Admitting: Family Medicine

## 2014-07-06 NOTE — Telephone Encounter (Signed)
Spoke with patient agrees to come see Dr. Patsy Lageropland for additional refills as Cymbalta was weaned and Zoloft was started in Feb.  Denies side effects currently.

## 2014-07-06 NOTE — Telephone Encounter (Signed)
Patient has not been seen in a while for this medication. Advise on refill.

## 2014-08-04 ENCOUNTER — Other Ambulatory Visit: Payer: Self-pay | Admitting: Family Medicine

## 2014-08-16 ENCOUNTER — Ambulatory Visit (HOSPITAL_COMMUNITY)
Admission: RE | Admit: 2014-08-16 | Discharge: 2014-08-16 | Disposition: A | Discharge status: Home / Self Care | Payer: BLUE CROSS/BLUE SHIELD | Source: Ambulatory Visit | Attending: Emergency Medicine | Admitting: Emergency Medicine

## 2014-08-16 ENCOUNTER — Ambulatory Visit (INDEPENDENT_AMBULATORY_CARE_PROVIDER_SITE_OTHER): Payer: BLUE CROSS/BLUE SHIELD | Admitting: Emergency Medicine

## 2014-08-16 ENCOUNTER — Telehealth: Payer: Self-pay | Admitting: Radiology

## 2014-08-16 ENCOUNTER — Encounter (HOSPITAL_COMMUNITY): Payer: Self-pay

## 2014-08-16 VITALS — BP 106/58 | HR 78 | Temp 98.0°F | Resp 20 | Ht 69.0 in | Wt 179.1 lb

## 2014-08-16 DIAGNOSIS — R109 Unspecified abdominal pain: Secondary | ICD-10-CM | POA: Insufficient documentation

## 2014-08-16 DIAGNOSIS — R1031 Right lower quadrant pain: Secondary | ICD-10-CM

## 2014-08-16 DIAGNOSIS — R102 Pelvic and perineal pain: Secondary | ICD-10-CM

## 2014-08-16 LAB — POCT URINALYSIS DIPSTICK
BILIRUBIN UA: NEGATIVE
Glucose, UA: NEGATIVE
Ketones, UA: NEGATIVE
LEUKOCYTES UA: NEGATIVE
Nitrite, UA: NEGATIVE
Protein, UA: NEGATIVE
Spec Grav, UA: 1.02
UROBILINOGEN UA: 0.2
pH, UA: 5.5

## 2014-08-16 LAB — POCT UA - MICROSCOPIC ONLY
Casts, Ur, LPF, POC: NEGATIVE
Crystals, Ur, HPF, POC: NEGATIVE
YEAST UA: NEGATIVE

## 2014-08-16 LAB — POCT CBC
Granulocyte percent: 71.8 %G (ref 37–80)
HCT, POC: 36.4 % — AB (ref 37.7–47.9)
Hemoglobin: 11.3 g/dL — AB (ref 12.2–16.2)
LYMPH, POC: 2.2 (ref 0.6–3.4)
MCH: 23.6 pg — AB (ref 27–31.2)
MCHC: 31 g/dL — AB (ref 31.8–35.4)
MCV: 76.2 fL — AB (ref 80–97)
MID (cbc): 0.6 (ref 0–0.9)
MPV: 8.7 fL (ref 0–99.8)
PLATELET COUNT, POC: 338 10*3/uL (ref 142–424)
POC Granulocyte: 7.2 — AB (ref 2–6.9)
POC LYMPH PERCENT: 22.3 %L (ref 10–50)
POC MID %: 5.9 %M (ref 0–12)
RBC: 4.78 M/uL (ref 4.04–5.48)
RDW, POC: 17.3 %
WBC: 10 10*3/uL (ref 4.6–10.2)

## 2014-08-16 LAB — POCT URINE PREGNANCY: PREG TEST UR: NEGATIVE

## 2014-08-16 MED ORDER — IOHEXOL 300 MG/ML  SOLN
100.0000 mL | Freq: Once | INTRAMUSCULAR | Status: AC | PRN
  Administered 2014-08-16: 100 mL via INTRAVENOUS

## 2014-08-16 NOTE — Progress Notes (Addendum)
Subjective:  This chart was scribed for Leslie Chris, MD by Andrew Au, ED Scribe. This patient was seen in room 8 and the patient's care was started at 2:53 PM.   Patient ID: Leslie Huber, female    DOB: 1975-07-14, 39 y.o.   MRN: [id number]  HPI Chief Complaint  Patient presents with  . Back Pain    on the right in her back. starts in her back and feels like it shoots straight through to the front on her abd.  Nausea x 2-3 days. dry heaving on friday. sitting makes the pain worse.    HPI Comments: Leslie Huber is a 39 y.o. female who [facility] complaining of gradually worsening, right, lower, back pain onset 3 days. She describes pain to be dull with occasional sharp stabbing pain that can last for a minute followed by throbbing shortly after. She reports radiating pain to her abdomen. She had associated nausea 2-3 days ago with some dry heaving but no emesis. 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 States she is currently late on her menstrual period and reports her last menses to be 07/01/14. She usually experiences some back pain as premenstraul symptoms. She has hx of tubal ligation. She denies fever and chills.  Past Medical History  Diagnosis Date  . Anemia   . Anxiety   . Depression   . 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 Fibromyalgia, secondary 2013    in relation to Sarcoidosis  . Ulcer 1999    duodenum  . Hypotension   . Ovarian cyst   . Migraine    Past Surgical History  Procedure Laterality Date  . Tubal ligation    . Hand tendon surgery Left     pinky, 5th digit  . Lymphnode Right 2006    pelvic area; benign   Prior to Admission medications   Medication Sig Start Date End Date Taking? Authorizing Provider  clonazePAM (KLONOPIN) 0.5 MG disintegrating tablet Take 1 tablet (0.5 mg total) by mouth 2 (two) times daily as needed. 12/01/13  Yes Gwenlyn Found Copland,  MD  cyclobenzaprine (FLEXERIL) 10 MG tablet TAKE 1/2 TO 1 TABLET BY MOUTH AT BEDTIME AS NEEDED FOR MUSCLE PAIN 12/17/13  Yes Gwenlyn Found Copland, MD  IRON PO Take 45 mg by mouth daily.   Yes Historical Provider, MD  Multiple Vitamins-Minerals (CORAL CALCIUM PLUS PO) Take by mouth.   Yes Historical Provider, MD  naproxen sodium (ANAPROX) 220 MG tablet Take 220 mg by mouth 2 (two) times daily with a meal.   Yes Historical Provider, MD  Probiotic Product (ALIGN) 4 MG CAPS Take 1 capsule by mouth daily.   Yes Historical Provider, MD  albuterol (PROVENTIL HFA;VENTOLIN HFA) 108 (90 BASE) MCG/ACT inhaler Inhale 2 puffs into the lungs every 6 (six) hours as needed for wheezing or shortness of breath. Patient not taking: Reported on 03/10/2014 02/25/14   Pearline Cables, MD  phenazopyridine (PYRIDIUM) 200 MG tablet Take 1 tablet (200 mg total) by mouth 3 (three) times daily as needed for pain. Patient not taking: Reported on 08/16/2014 03/10/14   Morrell Riddle, PA-C  sertraline (ZOLOFT) 50 MG tablet TAKE 1 TABLET EVERY DAY.  "OV NEEDED FOR FURTHER REFILLS" Patient not taking: Reported on 08/16/2014 08/05/14   Porfirio Oar, PA-C   Review of Systems  Constitutional: Negative for fever and chills.  Gastrointestinal: Positive for nausea and abdominal pain. Negative for vomiting.  Genitourinary: Positive for menstrual problem.  Musculoskeletal: Positive for myalgias and back pain.    Objective:   Physical Exam CONSTITUTIONAL: Well developed/well nourished HEAD: Normocephalic/atraumatic EYES: EOMI/PERRL ENMT: Mucous membranes moist NECK: supple no meningeal signs SPINE/BACK:entire spine nontender CV: S1/S2 noted, no murmurs/rubs/gallops noted LUNGS: Lungs are clear to auscultation bilaterally, no apparent distress ABDOMEN: soft, nontender, no rebound or guarding, bowel sounds noted throughout abdomen. Tenderness deep right lower abdomen  GU:no cva tenderness NEURO: Pt is awake/alert/appropriate, moves all  extremitiesx4.  No facial droop.   EXTREMITIES: pulses normal/equal, full ROM SKIN: warm, color normal PSYCH: no abnormalities of mood noted, alert and oriented to situation  Filed Vitals:   08/16/14 1440  BP: 106/58  Pulse: 78  Temp: 98 F (36.7 C)  TempSrc: Oral  Resp: 20  Height: 5\' 9"  (1.753 m)  Weight: 179 lb 2 oz (81.251 kg)  SpO2: 99%   Results for orders placed or performed in visit on 08/16/14  POCT urinalysis dipstick  Result Value Ref Range   Color, UA yellow    Clarity, UA clear    Glucose, UA negative    Bilirubin, UA negative    Ketones, UA negative    Spec Grav, UA 1.020    Blood, UA small    pH, UA 5.5    Protein, UA negative    Urobilinogen, UA 0.2    Nitrite, UA negative]    Leukocytes, UA Negative Negative  POCT urine pregnancy  Result Value Ref Range   Preg Test, Ur Negative Negative  POCT UA - Microscopic Only  Result Value Ref Range   WBC, Ur, HPF, POC Rare    RBC, urine, microscopic 0-5    Bacteria, U Microscopic trace    Mucus, UA trace    Epithelial cells, urine per micros 0-2    Crystals, Ur, HPF, POC negative    Casts, Ur, LPF, POC negative    Yeast, UA negative   POCT CBC  Result Value Ref Range   WBC 10.0 4.6 - 10.2 K/uL   Lymph, poc 2.2 0.6 - 3.4   POC LYMPH PERCENT 22.3 10 - 50 %L   MID (cbc) 0.6 0 - 0.9   POC MID % 5.9 0 - 12 %M   POC Granulocyte 7.2 (A) 2 - 6.9   Granulocyte percent 71.8 37 - 80 %G   RBC 4.78 4.04 - 5.48 M/uL   Hemoglobin 11.3 (A) 12.2 - 16.2 g/dL   HCT, POC 40.936.4 (A) 81.137.7 - 47.9 %   MCV 76.2 (A) 80 - 97 fL   MCH, POC 23.6 (A) 27 - 31.2 pg   MCHC 31.0 (A) 31.8 - 35.4 g/dL   RDW, POC 91.417.3 %   Platelet Count, POC 338 142 - 424 K/uL   MPV 8.7 0 - 99.8 fL   Assessment & Plan:  Patient does have tenderness right lower abdomen. There is no rebound. I did a pelvic examination and could not feel an adnexal mass. We'll proceed with CT abdomen pelvis.I personally performed the services described in this  documentation, which was scribed in my presence. The recorded information has been reviewed and is accurate.CT positive moderate stool burden  Earl LitesSteve Daub, MD

## 2014-08-16 NOTE — Telephone Encounter (Addendum)
I tried to get prior notification for patients CT scan online. I have gotten notice [facility] is down for maintenance. The patient has been sent for urgent CT scan without insurance authorization, can you work on this Tuesday since tomorrow is a holiday ?

## 2014-08-17 ENCOUNTER — Ambulatory Visit (INDEPENDENT_AMBULATORY_CARE_PROVIDER_SITE_OTHER): Payer: BLUE CROSS/BLUE SHIELD | Admitting: Emergency Medicine

## 2014-08-17 VITALS — BP 102/60 | HR 72 | Temp 98.1°F | Resp 15 | Ht 69.0 in | Wt 177.0 lb

## 2014-08-17 DIAGNOSIS — K921 Melena: Secondary | ICD-10-CM | POA: Diagnosis not present

## 2014-08-17 DIAGNOSIS — R1030 Lower abdominal pain, unspecified: Secondary | ICD-10-CM | POA: Diagnosis not present

## 2014-08-17 LAB — POCT SEDIMENTATION RATE: POCT SED RATE: 24 mm/hr — AB (ref 0–22)

## 2014-08-17 LAB — POCT CBC
GRANULOCYTE PERCENT: 65.7 % (ref 37–80)
HEMATOCRIT: 38.1 % (ref 37.7–47.9)
HEMOGLOBIN: 12 g/dL — AB (ref 12.2–16.2)
LYMPH, POC: 2.1 (ref 0.6–3.4)
MCH, POC: 24.3 pg — AB (ref 27–31.2)
MCHC: 31.6 g/dL — AB (ref 31.8–35.4)
MCV: 76.9 fL — AB (ref 80–97)
MID (cbc): 0.5 (ref 0–0.9)
MPV: 8.7 fL (ref 0–99.8)
POC Granulocyte: 4.9 (ref 2–6.9)
POC LYMPH PERCENT: 27.9 %L (ref 10–50)
POC MID %: 6.4 %M (ref 0–12)
Platelet Count, POC: 351 10*3/uL (ref 142–424)
RBC: 4.95 M/uL (ref 4.04–5.48)
RDW, POC: 19 %
WBC: 7.4 10*3/uL (ref 4.6–10.2)

## 2014-08-17 LAB — HEMOCCULT GUIAC POC 1CARD (OFFICE): FECAL OCCULT BLD: NEGATIVE

## 2014-08-17 LAB — C-REACTIVE PROTEIN

## 2014-08-17 NOTE — Patient Instructions (Signed)

## 2014-08-17 NOTE — Progress Notes (Addendum)
Subjective:  This chart was scribed for [name] MD, by Veverly Fells, at [facility].  This patient was seen in room 3 and the patient's care was started at 12:03 PM.    Patient ID: Leslie Huber, female    DOB: 1975/03/01, 39 y.o.   MRN: [id number]  HPI  HPI Comments: Leslie Huber is a 39 y.o. female who presents to the [facility] complaining of blood in her stool onset yesterday when she got [facility] (after seeing me). She denies any increase in bowel movements.  Patient has associated symptoms of nausea which she states has "sky rocketed" as well as abdominal pain (more heavily on the left side with palpation). She describes her stool as "bloody globs" and states that nothing else was coming out besides blood.   [place] around many kinds of farm animals daily. She denies any recent weight loss.  Her father had irritable bowel syndrome.   Tests: She was checked for celiacs disease (negative) at her GI doctor 1 year ago.  She was sent for a scan last night which came back normal. She has been to [facility] seen by Dr. Elnoria Howard).     Patient Active Problem List   Diagnosis Date Noted  . Actinic keratosis 11/15/2013  . Right sided weakness 03/25/2013  . Numbness on right side 03/25/2013  . 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 Dysmenorrhea 01/22/2013  . Menorrhagia 01/22/2013  . Sarcoidosis 10/30/2012  . Fibromyalgia, secondary 10/30/2012  . Depression 10/30/2012   Past Medical History  Diagnosis Date  . Anemia   . Anxiety   . Depression   . 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 Fibromyalgia, secondary 2013    in relation to Sarcoidosis  . Ulcer 1999    duodenum  . Hypotension   . Ovarian cyst   . Migraine    Past Surgical History  Procedure Laterality Date  . Tubal ligation    . Hand tendon surgery Left     pinky, 5th digit  . Lymphnode Right 2006   pelvic area; benign   Allergies  Allergen Reactions  . Sulfa Antibiotics Anaphylaxis   Prior to Admission medications   Medication Sig Start Date End Date Taking? Authorizing Provider  clonazePAM (KLONOPIN) 0.5 MG disintegrating tablet Take 1 tablet (0.5 mg total) by mouth 2 (two) times daily as needed. 12/01/13  Yes Gwenlyn Found Copland, MD  cyclobenzaprine (FLEXERIL) 10 MG tablet TAKE 1/2 TO 1 TABLET BY MOUTH AT BEDTIME AS NEEDED FOR MUSCLE PAIN 12/17/13  Yes Gwenlyn Found Copland, MD  IRON PO Take 45 mg by mouth daily.   Yes Historical Provider, MD  Multiple Vitamins-Minerals (CORAL CALCIUM PLUS PO) Take by mouth.   Yes Historical Provider, MD  naproxen sodium (ANAPROX) 220 MG tablet Take 220 mg by mouth 2 (two) times daily with a meal.   Yes Historical Provider, MD  Probiotic Product (ALIGN) 4 MG CAPS Take 1 capsule by mouth daily.   Yes Historical Provider, MD  albuterol (PROVENTIL HFA;VENTOLIN HFA) 108 (90 BASE) MCG/ACT inhaler Inhale 2 puffs into the lungs every 6 (six) hours as needed for wheezing or shortness of breath. Patient not taking: Reported on 03/10/2014 02/25/14   Pearline Cables, MD  phenazopyridine (PYRIDIUM) 200 MG tablet Take 1 tablet (200 mg total) by mouth 3 (three) times daily as needed for pain. Patient not  taking: Reported on 08/16/2014 03/10/14   Morrell RiddleSarah L Weber, PA-C  sertraline (ZOLOFT) 50 MG tablet TAKE 1 TABLET EVERY DAY.  "OV NEEDED FOR FURTHER REFILLS" Patient not taking: Reported on 08/16/2014 08/05/14   Porfirio Oarhelle Jeffery, PA-C   History   Social History  . Marital Status: Married    Spouse Name: Creola CornMike Tech  . Number of Children: 3  . Years of Education: college   Occupational History  . former Museum/gallery conservatorvet tech   . farm   . Stay-at-Home Mom    Social History Main Topics  . Smoking status: Never Smoker   . Smokeless tobacco: Never Used  . Alcohol Use: 0.0 - 1.2 oz/week    0-2 Standard drinks or equivalent per week     Comment: occasional; 1 drink a week  . Drug Use: No   . Sexual Activity:    Partners: Male    Birth Control/ Protection: Surgical   Other Topics Concern  . 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   Caffeine Use: 1 cup of coffee     Current Outpatient Prescriptions on File Prior to Visit  Medication Sig Dispense Refill  . clonazePAM (KLONOPIN) 0.5 MG disintegrating tablet Take 1 tablet (0.5 mg total) by mouth 2 (two) times daily as needed. 30 tablet 1  . cyclobenzaprine (FLEXERIL) 10 MG tablet TAKE 1/2 TO 1 TABLET BY MOUTH AT BEDTIME AS NEEDED FOR MUSCLE PAIN 30 tablet 4  . IRON PO Take 45 mg by mouth daily.    . Multiple Vitamins-Minerals (CORAL CALCIUM PLUS PO) Take by mouth.    . naproxen sodium (ANAPROX) 220 MG tablet Take 220 mg by mouth 2 (two) times daily with a meal.    . Probiotic Product (ALIGN) 4 MG CAPS Take 1 capsule by mouth daily.    Marland Kitchen. albuterol (PROVENTIL HFA;VENTOLIN HFA) 108 (90 BASE) MCG/ACT inhaler Inhale 2 puffs into the lungs every 6 (six) hours as needed for wheezing or shortness of breath. (Patient not taking: Reported on 03/10/2014) 1 Inhaler 0  . phenazopyridine (PYRIDIUM) 200 MG tablet Take 1 tablet (200 mg total) by mouth 3 (three) times daily as needed for pain. (Patient not taking: Reported on 08/16/2014) 10 tablet 0  . sertraline (ZOLOFT) 50 MG tablet TAKE 1 TABLET EVERY DAY.  "OV NEEDED FOR FURTHER REFILLS" (Patient not taking: Reported on 08/16/2014) 30 tablet 0   No current facility-administered medications on file prior to visit.    Allergies  Allergen Reactions  . Sulfa Antibiotics Anaphylaxis      Review of Systems  Constitutional: Negative for fever and chills.  Eyes: Negative for pain, redness and itching.  Respiratory: Negative for cough, choking and shortness of breath.   Gastrointestinal: Positive for nausea, abdominal pain and diarrhea. Negative for vomiting.  Musculoskeletal: Negative for neck  pain and neck stiffness.       Objective:   Physical Exam  CONSTITUTIONAL: Well developed/well nourished HEAD: Normocephalic/atraumatic EYES: EOMI/PERRL ENMT: Mucous membranes moist NECK: supple no meningeal signs SPINE/BACK:entire spine nontender CV: S1/S2 noted, no murmurs/rubs/gallops noted LUNGS: Lungs are clear to auscultation bilaterally, no apparent distress ABDOMEN: soft, no rebound or guarding, bowel sounds noted throughout abdomen, Very mild left sided abdominal pain.  GU:no cva tenderness NEURO: Pt is awake/alert/appropriate, moves all extremitiesx4.  No facial droop.   EXTREMITIES: pulses normal/equal, full ROM SKIN: warm, color normal PSYCH: no  abnormalities of mood noted, alert and oriented to situation    Filed Vitals:   08/17/14 1155  BP: 102/60  Pulse: 72  Temp: 98.1 F (36.7 C)  TempSrc: Oral  Resp: 15  Height:  (1.753 m)  Weight: 177 lb (80.287 kg)  SpO2: 99%   Results for orders placed or performed in visit on 08/17/14  POCT CBC  Result Value Ref Range   WBC 7.4 4.6 - 10.2 K/uL   Lymph, poc 2.1 0.6 - 3.4   POC LYMPH PERCENT 27.9 10 - 50 %L   MID (cbc) 0.5 0 - 0.9   POC MID % 6.4 0 - 12 %M   POC Granulocyte 4.9 2 - 6.9   Granulocyte percent 65.7 37 - 80 %G   RBC 4.95 4.04 - 5.48 M/uL   Hemoglobin 12.0 (A) 12.2 - 16.2 g/dL   HCT, POC 29.5 62.1 - 47.9 %   MCV 76.9 (A) 80 - 97 fL   MCH, POC 24.3 (A) 27 - 31.2 pg   MCHC 31.6 (A) 31.8 - 35.4 g/dL   RDW, POC 30.8 %   Platelet Count, POC 351 142 - 424 K/uL   MPV 8.7 0 - 99.8 fL  Hemoccult - 1 Card (office)  Result Value Ref Range   Fecal Occult Blood, POC Negative Negative   Card #1 Date 08/17/2014    Card #2 Fecal Occult Blod, POC     Card #2 Date     Card #3 Fecal Occult Blood, POC     Card #3 Date          Assessment & Plan:  1. Lower abdominal pain  - POCT CBC - POCT SEDIMENTATION RATE - C-reactive protein - Gastrointestinal Pathogen Panel PCR - Hemoccult - 1 Card  (office) - Ambulatory referral to Gastroenterology  2. Hematochezia  - POCT CBC - POCT SEDIMENTATION RATE - C-reactive protein - Gastrointestinal Pathogen Panel PCR - Hemoccult - 1 Card (office) - Ambulatory referral to Gastroenterology   I personally performed the services described in this documentation, which was scribed in my presence. The recorded information has been reviewed and is accurate.  [name], MD  [facility], [facility]  08/17/2014 5:08 PM

## 2014-08-19 ENCOUNTER — Encounter: Payer: Self-pay | Admitting: Family Medicine

## 2014-08-19 ENCOUNTER — Encounter: Payer: Self-pay | Admitting: Emergency Medicine

## 2014-08-20 ENCOUNTER — Telehealth: Payer: Self-pay

## 2014-08-20 LAB — GASTROINTESTINAL PATHOGEN PANEL PCR

## 2014-08-20 NOTE — Telephone Encounter (Signed)
Call patient and tell her they were not able to run the testing on the stool specimen they received because it had leaked in the bag. We will need to collect another stool.

## 2014-08-20 NOTE — Telephone Encounter (Signed)
[facility] called. They were unable to run the stool test on the pt. They said the lid wasn't closed tightly on the container and it leaked out everywhere.

## 2014-08-21 NOTE — Telephone Encounter (Signed)
See labs 

## 2014-10-21 ENCOUNTER — Ambulatory Visit (INDEPENDENT_AMBULATORY_CARE_PROVIDER_SITE_OTHER): Payer: BLUE CROSS/BLUE SHIELD

## 2014-10-21 ENCOUNTER — Ambulatory Visit (INDEPENDENT_AMBULATORY_CARE_PROVIDER_SITE_OTHER): Payer: BLUE CROSS/BLUE SHIELD | Admitting: Urgent Care

## 2014-10-21 VITALS — BP 120/68 | HR 69 | Temp 98.6°F | Resp 16 | Ht 69.0 in | Wt 178.0 lb

## 2014-10-21 DIAGNOSIS — Z862 Personal history of diseases of the blood and blood-forming organs and certain disorders involving the immune mechanism: Secondary | ICD-10-CM | POA: Diagnosis not present

## 2014-10-21 DIAGNOSIS — F419 Anxiety disorder, unspecified: Secondary | ICD-10-CM

## 2014-10-21 DIAGNOSIS — G4700 Insomnia, unspecified: Secondary | ICD-10-CM | POA: Diagnosis not present

## 2014-10-21 DIAGNOSIS — R0789 Other chest pain: Secondary | ICD-10-CM

## 2014-10-21 MED ORDER — CLONAZEPAM 0.5 MG PO TBDP: 0.5000 mg | ORAL_TABLET | Freq: Two times a day (BID) | ORAL | Status: AC | PRN

## 2014-10-21 MED ORDER — CLONAZEPAM 0.5 MG PO TBDP
0.5000 mg | ORAL_TABLET | Freq: Two times a day (BID) | ORAL | Status: DC | PRN
Start: 2014-10-21 — End: 2014-10-21

## 2014-10-21 MED ORDER — SERTRALINE HCL 50 MG PO TABS: 50.0000 mg | ORAL_TABLET | Freq: Every day | ORAL | Status: DC

## 2014-10-21 NOTE — Patient Instructions (Signed)
Generalized Anxiety Disorder Generalized anxiety disorder (GAD) is a mental disorder. It interferes with life functions, including relationships, work, and school. GAD is different from normal anxiety, which everyone experiences at some point in their lives in response to specific life events and activities. Normal anxiety actually helps us prepare for and get through these life events and activities. Normal anxiety goes away after the event or activity is over.  GAD causes anxiety that is not necessarily related to specific events or activities. It also causes excess anxiety in proportion to specific events or activities. The anxiety associated with GAD is also difficult to control. GAD can vary from mild to severe. People with severe GAD can have intense waves of anxiety with physical symptoms (panic attacks).  SYMPTOMS The anxiety and worry associated with GAD are difficult to control. This anxiety and worry are related to many life events and activities and also occur more days than not for 6 months or longer. People with GAD also have three or more of the following symptoms (one or more in children):  Restlessness.   Fatigue.  Difficulty concentrating.   Irritability.  Muscle tension.  Difficulty sleeping or unsatisfying sleep. DIAGNOSIS GAD is diagnosed through an assessment by your health care provider. Your health care provider will ask you questions aboutyour mood,physical symptoms, and events in your life. Your health care provider may ask you about your medical history and use of alcohol or drugs, including prescription medicines. Your health care provider may also do a physical exam and blood tests. Certain medical conditions and the use of certain substances can cause symptoms similar to those associated with GAD. Your health care provider may refer you to a mental health specialist for further evaluation. TREATMENT The following therapies are usually used to treat GAD:    Medication. Antidepressant medication usually is prescribed for long-term daily control. Antianxiety medicines may be added in severe cases, especially when panic attacks occur.   Talk therapy (psychotherapy). Certain types of talk therapy can be helpful in treating GAD by providing support, education, and guidance. A form of talk therapy called cognitive behavioral therapy can teach you healthy ways to think about and react to daily life events and activities.  Stress managementtechniques. These include yoga, meditation, and exercise and can be very helpful when they are practiced regularly. A mental health specialist can help determine which treatment is best for you. Some people see improvement with one therapy. However, other people require a combination of therapies. Document Released: 05/27/2012 Document Revised: 06/16/2013 Document Reviewed: 05/27/2012 ExitCare Patient Information 2015 [employer]. This information is not intended to replace advice given to you by your health care provider. Make sure you discuss any questions you have with your health care provider.  

## 2014-10-21 NOTE — Progress Notes (Signed)
    MRN: [id number] DOB: [birth year]  Subjective:   Leslie Huber is a 39 y.o. female past medical history of anxiety and sarcoidosis presenting for follow up on anxiety. She has previously been managed with Zoloft 50 mg and Klonopin 0.5mg . She has not been on these medications since the early summer due to being out of refills. She reports that she did well over the summer but yesterday her husband lost his job. She has since felt really anxious, 10 transient and intermittent midsternum chest pain, felt heart racing and slight shortness of breath. She denies pleuritic pain, diaphoresis, neck pain, jaw pain, nausea, vomiting, abdominal pain. She also denies feeling depressed, decreased energy, sleep disturbance, agitation. Denies any other aggravating or relieving factors, no other questions or concerns.  Rozalyn has a current medication list which includes the following prescription(s): cyclobenzaprine, iron, multiple vitamins-minerals, naproxen sodium, align, albuterol, clonazepam, and sertraline. Also is allergic to sulfa antibiotics.  Renad  has a past medical history of Anemia; Anxiety; Depression; Sarcoidosis (2010); Fibromyalgia, secondary (2013); Ulcer (1999); Hypotension; Ovarian cyst; and Migraine. Also  has past surgical history that includes Tubal ligation; Hand tendon surgery (Left); and lymphnode (Right, 2006).  Objective:   Vitals: BP 120/68 mmHg  Pulse 69  Temp(Src) 98.6 F (37 C) (Oral)  Resp 16  Ht  (1.753 m)  Wt 178 lb (80.74 kg)  BMI 26.27 kg/m2  SpO2 98%  LMP 10/16/2014  Physical Exam  Constitutional: She is oriented to person, place, and time. She appears well-developed and well-nourished.  Cardiovascular: Normal rate, regular rhythm and intact distal pulses.  Exam reveals no gallop and no friction rub.   No murmur heard. Pulmonary/Chest: No respiratory distress. She has no wheezes. She has no rales.  Abdominal: Soft. Bowel sounds are normal. She  exhibits no distension and no mass. There is no tenderness.  Neurological: She is alert and oriented to person, place, and time.  Skin: Skin is warm and dry. No rash noted. No erythema. No pallor.  Psychiatric: She has a normal mood and affect.   ECG interpretation by Dr. Cleta Alberts and PA-Khi Mcmillen - normal sinus rhythm.  [facility] reading (PRIMARY) by  Dr. Cleta Alberts and PA-Mehdi Gironda. Chest - increased interstitial markings but no evidence of hilar adenopathy or infiltrates.  Assessment and Plan :   1. Anxiety - Refill Zoloft, offered to increase dose as needed for her anxiety especially going forward without the guarantee that her husband will find work soon, her anxiety may very well become more difficulty to manage with just  of Zoloft.  2. Insomnia - Take Klonopin as needed for sleep.  3. Atypical chest pain - Stable, likely related to her anxiety but will continue to monitor.  4. History of sarcoidosis - Stable, continue to monitor.  Wallis Bamberg, PA-C [facility] [phone] 10/21/2014 12:17 PM

## 2015-01-04 IMAGING — CT CT ABD-PELV W/ CM
2 of 4 series · 17 of 46 positions shown, 19 images · IV contrast (CONTRAST)
Comparison: None.

CLINICAL DATA: Right-sided back pain.

EXAM:
CT ABDOMEN AND PELVIS WITH CONTRAST
TECHNIQUE: Multidetector CT imaging of the abdomen and pelvis was performed
using the standard protocol following bolus administration of
intravenous contrast.
CONTRAST:  100mL OMNIPAQUE IOHEXOL 300 MG/ML  SOLN

[Series 2: routine · axial · 0.73mm/px · z∈[+469,+899]mm · 14 of 94 slices shown, 16 images]
[im 4/94  soft-tissue]
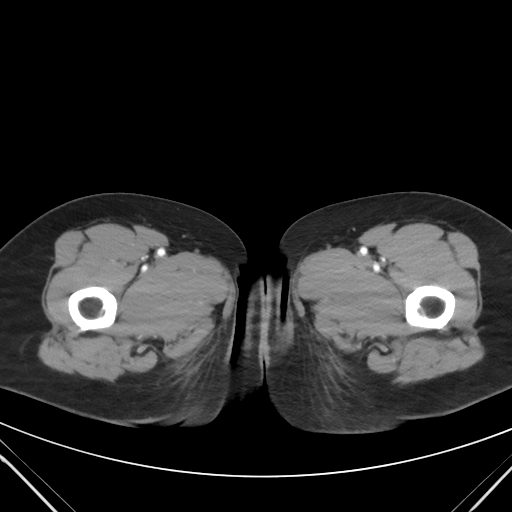
[im 4/94  bone]
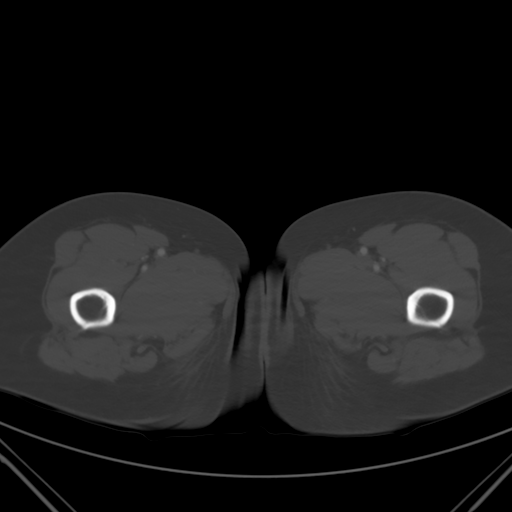
[im 12/94  soft-tissue]
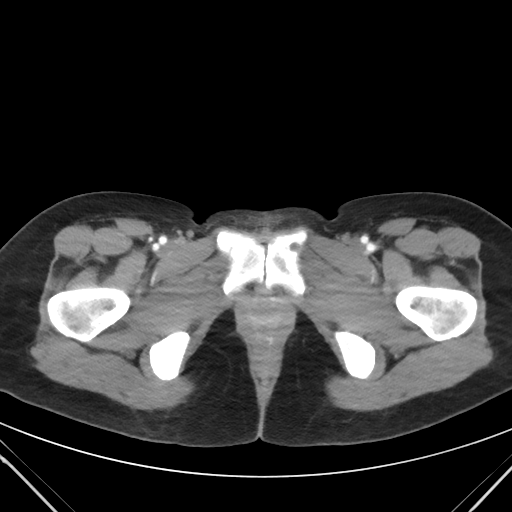
[im 20/94  soft-tissue]
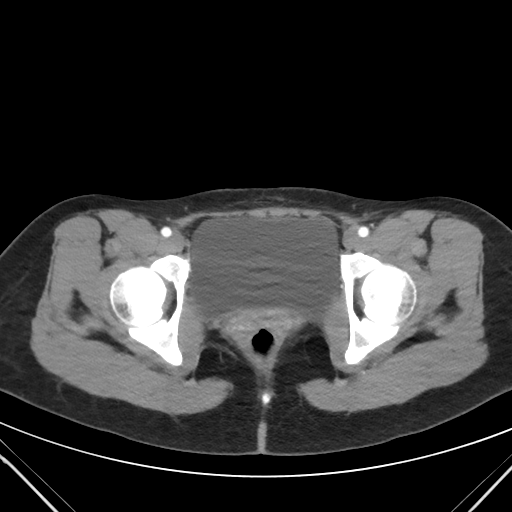
[im 24/94  soft-tissue]
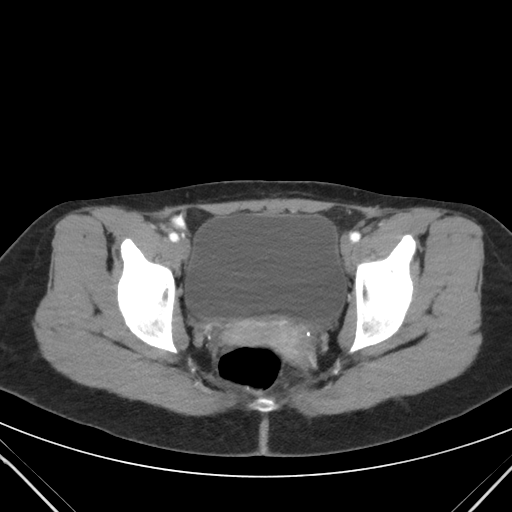
[im 32/94  soft-tissue]
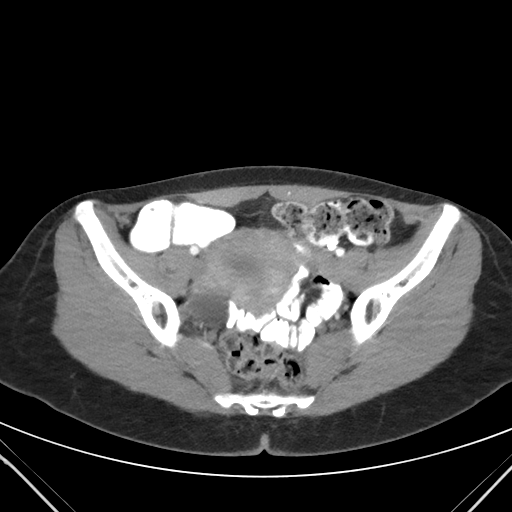
[im 39/94  soft-tissue]
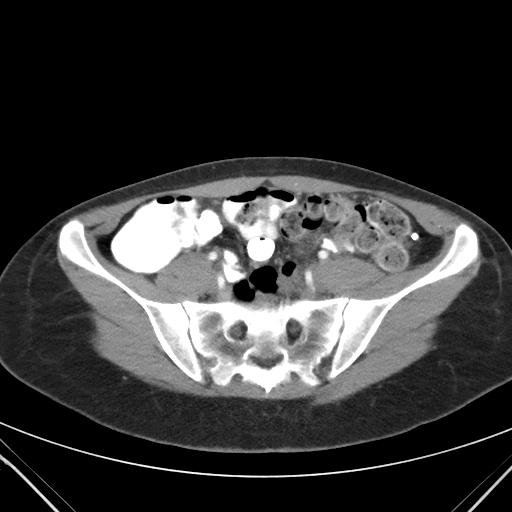
[im 43/94  soft-tissue]
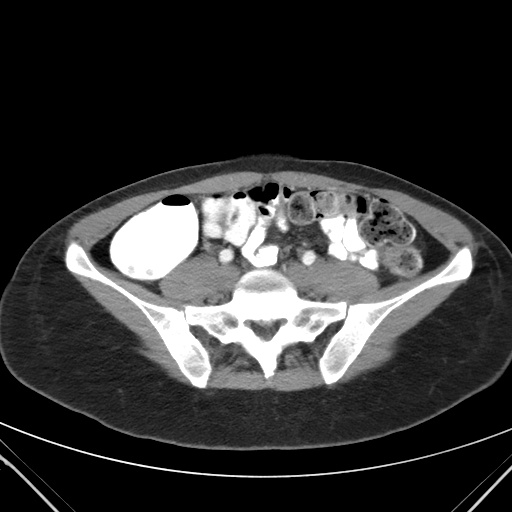
[im 51/94  soft-tissue]
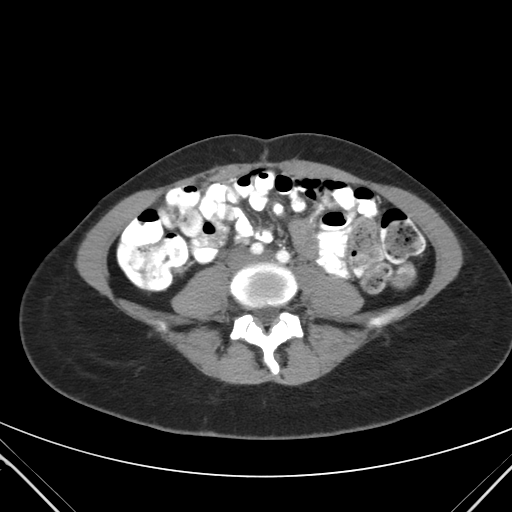
[im 55/94  soft-tissue]
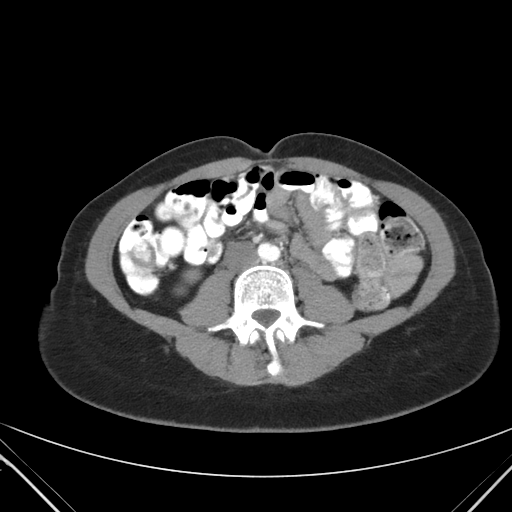
[im 55/94  bone]
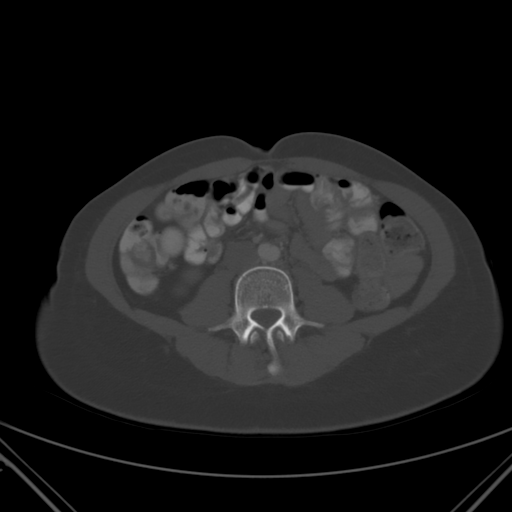
[im 63/94  soft-tissue]
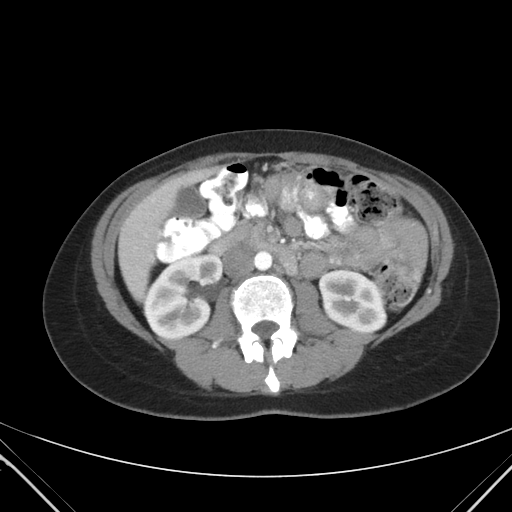
[im 70/94  soft-tissue]
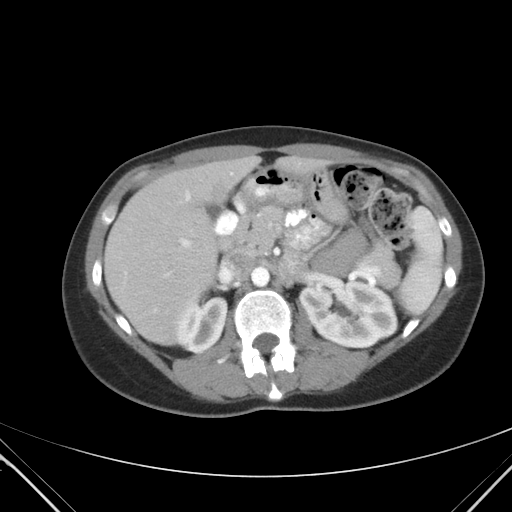
[im 74/94  soft-tissue]
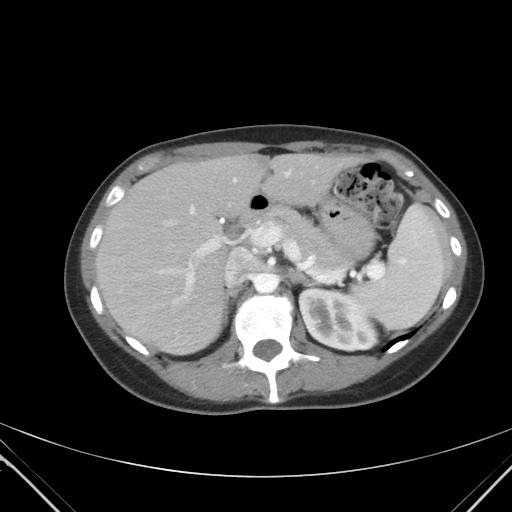
[im 82/94  soft-tissue]
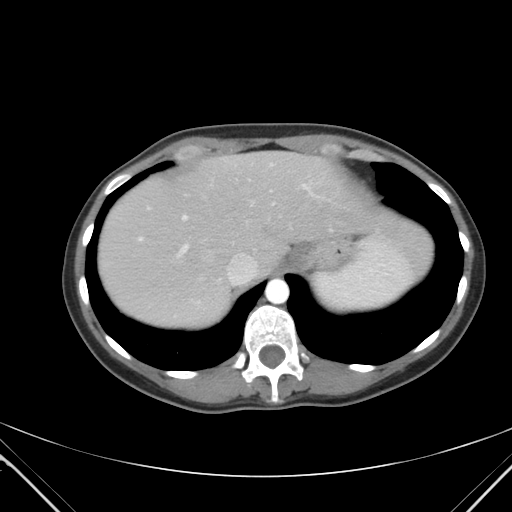
[im 90/94  soft-tissue]
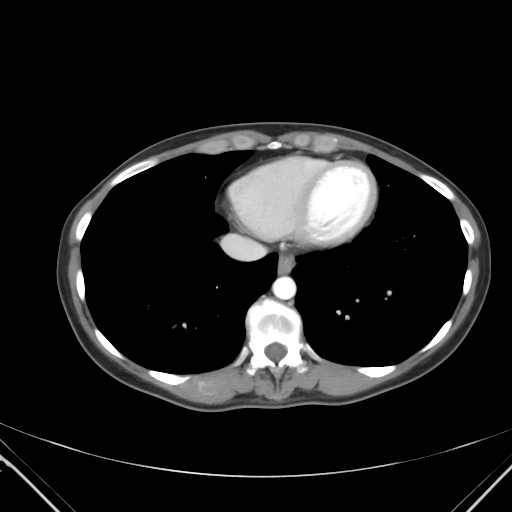

[cor · coronal · 0.91mm/px · 3 of 84 slices shown]
[im 28/84  soft-tissue]
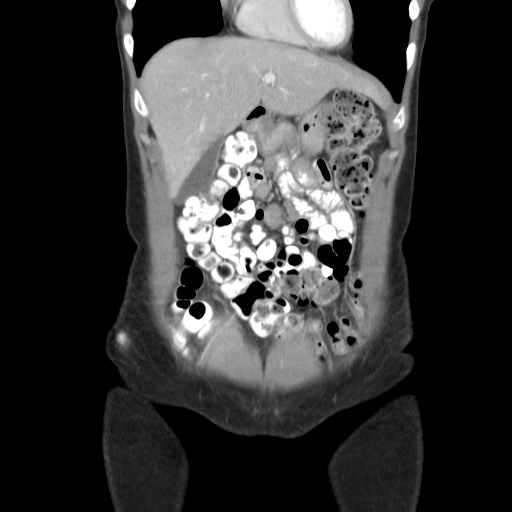
[im 37/84  soft-tissue]
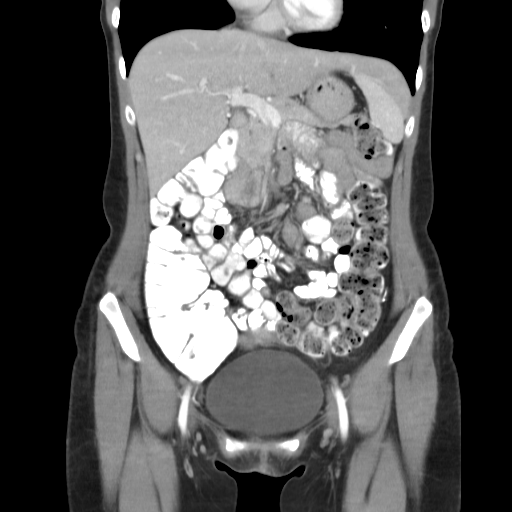
[im 47/84  soft-tissue]
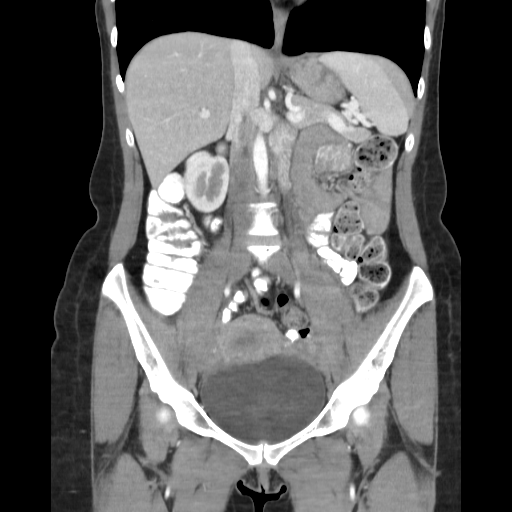

[17 of 46 positions shown; findings below may reference images not displayed]

FINDINGS: BODY WALL: Unremarkable.

LOWER CHEST:

Mediastinum: Unremarkable.

Lungs/pleura: 3 mm peripheral left lower lobe pulmonary nodule is
likely post infectious or inflammatory based on age and small size.

ABDOMEN/PELVIS:

Liver: No focal abnormality.

Biliary: No evidence of biliary obstruction or stone.

Pancreas: Unremarkable.

Spleen: Unremarkable.

Adrenals: Unremarkable.

Kidneys and ureters: No hydronephrosis or stone.

Bladder: Unremarkable.

Bowel: No obstruction. Appendix not clearly identified, but there is
no pericecal inflammatory changes.

Retroperitoneum: No mass or adenopathy.

Peritoneum: No free fluid or gas.

Reproductive: 3.6 cm cyst in the right ovary, without evidence of
enhancing nodule. The surrounding ovarian parenchyma does not appear
edematous, arguing against torsion.

Vascular: No acute abnormality.

OSSEOUS: No acute abnormalities. No suspicious lytic or blastic
lesions.
IMPRESSION: 1. No acute intra-abdominal findings.
2. 3.6 cm right ovarian cyst.

## 2015-05-13 ENCOUNTER — Other Ambulatory Visit: Payer: Self-pay | Admitting: Urgent Care

## 2016-06-28 ENCOUNTER — Encounter: Payer: Self-pay | Admitting: Gynecology

## 2023-09-21 ENCOUNTER — Other Ambulatory Visit: Payer: Self-pay | Admitting: Medical Genetics

## 2023-10-04 ENCOUNTER — Encounter (INDEPENDENT_AMBULATORY_CARE_PROVIDER_SITE_OTHER): Payer: Self-pay

## 2023-11-02 ENCOUNTER — Other Ambulatory Visit (HOSPITAL_COMMUNITY)

## 2024-01-22 ENCOUNTER — Other Ambulatory Visit (HOSPITAL_COMMUNITY)

## 2024-03-04 ENCOUNTER — Other Ambulatory Visit: Payer: Self-pay | Admitting: Medical Genetics

## 2024-03-04 DIAGNOSIS — Z006 Encounter for examination for normal comparison and control in clinical research program: Secondary | ICD-10-CM
